# Patient Record
Sex: Female | Born: 1945 | Race: White | Hispanic: No | Marital: Married
Health system: Southern US, Community
[De-identification: ages and names within clinical notes are randomized; demographics above are authoritative.]

---

## 2013-06-29 ENCOUNTER — Inpatient Hospital Stay: Payer: Self-pay | Admitting: Family Medicine

## 2013-06-29 LAB — RETICULOCYTES
Absolute Retic Count: 0.0548 10*6/uL
Reticulocyte: 2.05 %

## 2013-06-29 LAB — APTT: Activated PTT: 56.1 secs — ABNORMAL HIGH (ref 23.6–35.9)

## 2013-06-29 LAB — CBC
MCHC: <26.5 g/dL (ref 32.0–36.0)
MCV: 52 fL — ABNORMAL LOW (ref 80–100)
Platelet: 222 10*3/uL (ref 150–440)
RBC: 2.51 10*6/uL — ABNORMAL LOW (ref 3.80–5.20)
WBC: 2.3 10*3/uL — ABNORMAL LOW (ref 3.6–11.0)

## 2013-06-29 LAB — COMPREHENSIVE METABOLIC PANEL
Albumin: 3.9 g/dL (ref 3.4–5.0)
Alkaline Phosphatase: 104 U/L (ref 50–136)
Anion Gap: 9 (ref 7–16)
Calcium, Total: 8.7 mg/dL (ref 8.5–10.1)
Co2: 24 mmol/L (ref 21–32)
Creatinine: 0.47 mg/dL — ABNORMAL LOW (ref 0.60–1.30)
Glucose: 99 mg/dL (ref 65–99)
Potassium: 3.5 mmol/L (ref 3.5–5.1)
Sodium: 141 mmol/L (ref 136–145)

## 2013-06-29 LAB — FERRITIN: Ferritin (ARMC): 4 ng/mL — ABNORMAL LOW (ref 8–388)

## 2013-06-29 LAB — PROTIME-INR: Prothrombin Time: 60.1 secs — ABNORMAL HIGH (ref 11.5–14.7)

## 2013-06-29 LAB — IRON AND TIBC: Iron Saturation: 2 %

## 2013-06-29 LAB — FOLATE: Folic Acid: 17.9 ng/mL (ref 3.1–100.0)

## 2013-06-29 LAB — CK TOTAL AND CKMB (NOT AT ARMC): CK, Total: 22 U/L (ref 21–215)

## 2013-06-29 LAB — PRO B NATRIURETIC PEPTIDE: B-Type Natriuretic Peptide: 667 pg/mL — ABNORMAL HIGH (ref 0–125)

## 2013-06-30 LAB — CBC WITH DIFFERENTIAL/PLATELET
Basophil #: 0 10*3/uL (ref 0.0–0.1)
Basophil %: 0.5 %
Eosinophil #: 0 10*3/uL (ref 0.0–0.7)
Eosinophil %: 0.9 %
HCT: 19.9 % — ABNORMAL LOW (ref 35.0–47.0)
Lymphocyte #: 0.9 10*3/uL — ABNORMAL LOW (ref 1.0–3.6)
MCH: 18.9 pg — ABNORMAL LOW (ref 26.0–34.0)
MCHC: 30.2 g/dL — ABNORMAL LOW (ref 32.0–36.0)
MCV: 62 fL — ABNORMAL LOW (ref 80–100)
RDW: 32.6 % — ABNORMAL HIGH (ref 11.5–14.5)
WBC: 3.6 10*3/uL (ref 3.6–11.0)

## 2013-06-30 LAB — BASIC METABOLIC PANEL
Anion Gap: 8 (ref 7–16)
Co2: 25 mmol/L (ref 21–32)
EGFR (African American): >60
Glucose: 82 mg/dL (ref 65–99)
Osmolality: 278 (ref 275–301)
Potassium: 3.5 mmol/L (ref 3.5–5.1)

## 2013-06-30 LAB — PROTIME-INR
INR: 2.3
Prothrombin Time: 24.6 secs — ABNORMAL HIGH (ref 11.5–14.7)

## 2013-07-01 LAB — CBC WITH DIFFERENTIAL/PLATELET
Basophil #: 0 10*3/uL (ref 0.0–0.1)
Basophil #: 0.1 10*3/uL (ref 0.0–0.1)
Basophil %: 1 %
Eosinophil #: 0.1 10*3/uL (ref 0.0–0.7)
Eosinophil #: 0.2 10*3/uL (ref 0.0–0.7)
Eosinophil %: 3.6 %
HCT: 28.6 % — ABNORMAL LOW (ref 35.0–47.0)
HCT: 38.1 % (ref 35.0–47.0)
HGB: 12 g/dL (ref 12.0–16.0)
Lymphocyte #: 1.9 10*3/uL (ref 1.0–3.6)
MCH: 22.2 pg — ABNORMAL LOW (ref 26.0–34.0)
MCHC: 32.1 g/dL (ref 32.0–36.0)
MCHC: 31.6 g/dL — ABNORMAL LOW (ref 32.0–36.0)
MCV: 69 fL — ABNORMAL LOW (ref 80–100)
MCV: 71 fL — ABNORMAL LOW (ref 80–100)
Monocyte #: 0.3 x10 3/mm (ref 0.2–0.9)
Neutrophil #: 1.9 10*3/uL (ref 1.4–6.5)
Neutrophil %: 57.4 %
Neutrophil %: 48.2 %
Platelet: 177 10*3/uL (ref 150–440)
Platelet: 228 10*3/uL (ref 150–440)
RBC: 4.13 10*6/uL (ref 3.80–5.20)
RBC: 5.4 10*6/uL — ABNORMAL HIGH (ref 3.80–5.20)
RDW: 35.1 % — ABNORMAL HIGH (ref 11.5–14.5)
WBC: 3.3 10*3/uL — ABNORMAL LOW (ref 3.6–11.0)
WBC: 5 10*3/uL (ref 3.6–11.0)

## 2013-07-01 LAB — BASIC METABOLIC PANEL
Anion Gap: 5 — ABNORMAL LOW (ref 7–16)
Calcium, Total: 8.3 mg/dL — ABNORMAL LOW (ref 8.5–10.1)
Co2: 29 mmol/L (ref 21–32)
Creatinine: 0.53 mg/dL — ABNORMAL LOW (ref 0.60–1.30)
EGFR (African American): >60
EGFR (Non-African Amer.): >60
Glucose: 96 mg/dL (ref 65–99)
Potassium: 3.2 mmol/L — ABNORMAL LOW (ref 3.5–5.1)

## 2013-07-01 LAB — PROTIME-INR
INR: 1.5
INR: 1.2
Prothrombin Time: 17.8 secs — ABNORMAL HIGH (ref 11.5–14.7)

## 2013-07-02 LAB — CBC WITH DIFFERENTIAL/PLATELET
Basophil #: 0.1 10*3/uL (ref 0.0–0.1)
Basophil %: 1.9 %
Eosinophil #: 0.2 10*3/uL (ref 0.0–0.7)
Eosinophil %: 6.2 %
HCT: 36.1 % (ref 35.0–47.0)
HGB: 11.4 g/dL — ABNORMAL LOW (ref 12.0–16.0)
Lymphocyte #: 0.9 10*3/uL — ABNORMAL LOW (ref 1.0–3.6)
Lymphocyte %: 29 %
MCH: 22.4 pg — ABNORMAL LOW (ref 26.0–34.0)
MCHC: 31.7 g/dL — ABNORMAL LOW (ref 32.0–36.0)
MCV: 71 fL — ABNORMAL LOW (ref 80–100)
Monocyte #: 0.2 x10 3/mm (ref 0.2–0.9)
Neutrophil %: 56.5 %
Platelet: 215 10*3/uL (ref 150–440)
RBC: 5.1 10*6/uL (ref 3.80–5.20)
RDW: 35.8 % — ABNORMAL HIGH (ref 11.5–14.5)
WBC: 3 10*3/uL — ABNORMAL LOW (ref 3.6–11.0)

## 2013-07-02 LAB — APTT
Activated PTT: >160 secs (ref 23.6–35.9)
Activated PTT: 28.1 secs (ref 23.6–35.9)

## 2013-07-02 LAB — BASIC METABOLIC PANEL
Anion Gap: 8 (ref 7–16)
BUN: 7 mg/dL (ref 7–18)
Calcium, Total: 9.1 mg/dL (ref 8.5–10.1)
Chloride: 105 mmol/L (ref 98–107)
Co2: 26 mmol/L (ref 21–32)
Creatinine: 0.59 mg/dL — ABNORMAL LOW (ref 0.60–1.30)
EGFR (African American): >60
Glucose: 82 mg/dL (ref 65–99)
Osmolality: 275 (ref 275–301)
Potassium: 3.7 mmol/L (ref 3.5–5.1)
Sodium: 139 mmol/L (ref 136–145)

## 2013-07-03 LAB — CBC WITH DIFFERENTIAL/PLATELET
Basophil %: 1.2 %
Eosinophil #: 0.1 10*3/uL (ref 0.0–0.7)
Eosinophil %: 2.3 %
HGB: 8.6 g/dL — ABNORMAL LOW (ref 12.0–16.0)
Lymphocyte #: 0.8 10*3/uL — ABNORMAL LOW (ref 1.0–3.6)
MCH: 22.5 pg — ABNORMAL LOW (ref 26.0–34.0)
MCHC: 32.1 g/dL (ref 32.0–36.0)
MCV: 70 fL — ABNORMAL LOW (ref 80–100)
Monocyte #: 0.2 x10 3/mm (ref 0.2–0.9)
Neutrophil %: 66.5 %

## 2013-07-03 LAB — BASIC METABOLIC PANEL
Chloride: 106 mmol/L (ref 98–107)
Co2: 27 mmol/L (ref 21–32)
Creatinine: 0.5 mg/dL — ABNORMAL LOW (ref 0.60–1.30)
EGFR (African American): >60
EGFR (Non-African Amer.): >60
Osmolality: 276 (ref 275–301)
Potassium: 3.4 mmol/L — ABNORMAL LOW (ref 3.5–5.1)
Sodium: 140 mmol/L (ref 136–145)

## 2013-07-04 LAB — HEMOGLOBIN: HGB: 8.2 g/dL — ABNORMAL LOW (ref 12.0–16.0)

## 2013-07-04 LAB — BASIC METABOLIC PANEL
Anion Gap: 3 — ABNORMAL LOW (ref 7–16)
BUN: 6 mg/dL — ABNORMAL LOW (ref 7–18)
Calcium, Total: 8.2 mg/dL — ABNORMAL LOW (ref 8.5–10.1)
Chloride: 110 mmol/L — ABNORMAL HIGH (ref 98–107)
Co2: 31 mmol/L (ref 21–32)
EGFR (African American): >60
EGFR (Non-African Amer.): >60
Glucose: 86 mg/dL (ref 65–99)
Sodium: 144 mmol/L (ref 136–145)

## 2013-07-04 LAB — MAGNESIUM: Magnesium: 1.6 mg/dL — ABNORMAL LOW

## 2013-07-05 LAB — CBC WITH DIFFERENTIAL/PLATELET
Basophil #: 0 10*3/uL (ref 0.0–0.1)
HCT: 27.3 % — ABNORMAL LOW (ref 35.0–47.0)
HGB: 8.6 g/dL — ABNORMAL LOW (ref 12.0–16.0)
MCH: 22.7 pg — ABNORMAL LOW (ref 26.0–34.0)
Monocyte %: 8.4 %
Neutrophil #: 1.5 10*3/uL (ref 1.4–6.5)
Neutrophil %: 51.9 %
RBC: 3.81 10*6/uL (ref 3.80–5.20)
RDW: 35.3 % — ABNORMAL HIGH (ref 11.5–14.5)
WBC: 3 10*3/uL — ABNORMAL LOW (ref 3.6–11.0)

## 2013-07-05 LAB — APTT
Activated PTT: 28.1 secs (ref 23.6–35.9)
Activated PTT: 73.3 secs — ABNORMAL HIGH (ref 23.6–35.9)

## 2013-07-05 LAB — PROTIME-INR: Prothrombin Time: 14 secs (ref 11.5–14.7)

## 2013-07-06 LAB — CBC WITH DIFFERENTIAL/PLATELET
Eosinophil: 5 %
HCT: 27.6 % — ABNORMAL LOW (ref 35.0–47.0)
HGB: 8.8 g/dL — ABNORMAL LOW (ref 12.0–16.0)
Lymphocytes: 33 %
MCHC: 31.8 g/dL — ABNORMAL LOW (ref 32.0–36.0)
Platelet: 123 10*3/uL — ABNORMAL LOW (ref 150–440)
RBC: 3.87 10*6/uL (ref 3.80–5.20)
RDW: 35.2 % — ABNORMAL HIGH (ref 11.5–14.5)
Variant Lymphocyte - H1-Rlymph: 1 %

## 2013-07-06 LAB — POTASSIUM: Potassium: 3.3 mmol/L — ABNORMAL LOW (ref 3.5–5.1)

## 2013-07-06 LAB — PROTIME-INR
INR: 1
Prothrombin Time: 13.8 secs (ref 11.5–14.7)

## 2013-07-06 LAB — PATHOLOGY REPORT

## 2013-07-07 LAB — APTT
Activated PTT: 114.9 secs — ABNORMAL HIGH (ref 23.6–35.9)
Activated PTT: 76.8 secs — ABNORMAL HIGH (ref 23.6–35.9)

## 2013-07-07 LAB — PROTIME-INR: INR: 1.1

## 2013-07-07 LAB — CBC WITH DIFFERENTIAL/PLATELET
Basophil %: 1 %
Eosinophil #: 0.1 10*3/uL (ref 0.0–0.7)
Eosinophil %: 3.8 %
HCT: 26.7 % — ABNORMAL LOW (ref 35.0–47.0)
HGB: 8.7 g/dL — ABNORMAL LOW (ref 12.0–16.0)
Lymphocyte %: 33.5 %
MCHC: 32.6 g/dL (ref 32.0–36.0)
MCV: 71 fL — ABNORMAL LOW (ref 80–100)
Monocyte #: 0.2 x10 3/mm (ref 0.2–0.9)
Monocyte %: 7.8 %
Neutrophil %: 53.9 %
Platelet: 121 10*3/uL — ABNORMAL LOW (ref 150–440)
RBC: 3.77 10*6/uL — ABNORMAL LOW (ref 3.80–5.20)
RDW: 35.1 % — ABNORMAL HIGH (ref 11.5–14.5)
WBC: 2.6 10*3/uL — ABNORMAL LOW (ref 3.6–11.0)

## 2013-07-08 LAB — PROTIME-INR
INR: 1.2
Prothrombin Time: 15.4 secs — ABNORMAL HIGH (ref 11.5–14.7)

## 2013-07-08 LAB — HEMOGLOBIN: HGB: 8.5 g/dL — ABNORMAL LOW (ref 12.0–16.0)

## 2013-07-08 LAB — PLATELET COUNT: Platelet: 131 10*3/uL — ABNORMAL LOW (ref 150–440)

## 2013-07-08 LAB — POTASSIUM: Potassium: 3.9 mmol/L (ref 3.5–5.1)

## 2013-07-09 LAB — PROTIME-INR
INR: 1.4
Prothrombin Time: 17.1 secs — ABNORMAL HIGH (ref 11.5–14.7)

## 2013-07-09 LAB — APTT: Activated PTT: 90.7 secs — ABNORMAL HIGH (ref 23.6–35.9)

## 2013-07-10 LAB — CBC WITH DIFFERENTIAL/PLATELET
Basophil #: 0 10*3/uL (ref 0.0–0.1)
Basophil %: 1.3 %
HCT: 25.8 % — ABNORMAL LOW (ref 35.0–47.0)
HGB: 8.2 g/dL — ABNORMAL LOW (ref 12.0–16.0)
Lymphocyte #: 1 10*3/uL (ref 1.0–3.6)
MCH: 23 pg — ABNORMAL LOW (ref 26.0–34.0)
MCHC: 31.8 g/dL — ABNORMAL LOW (ref 32.0–36.0)
MCV: 72 fL — ABNORMAL LOW (ref 80–100)
Monocyte %: 11.7 %
RBC: 3.56 10*6/uL — ABNORMAL LOW (ref 3.80–5.20)
WBC: 2.9 10*3/uL — ABNORMAL LOW (ref 3.6–11.0)

## 2013-07-10 LAB — PROTIME-INR
INR: 1.6
Prothrombin Time: 19.3 secs — ABNORMAL HIGH (ref 11.5–14.7)

## 2013-07-10 LAB — APTT: Activated PTT: 91.3 secs — ABNORMAL HIGH (ref 23.6–35.9)

## 2013-07-11 LAB — CBC WITH DIFFERENTIAL/PLATELET
HCT: 25.5 % — ABNORMAL LOW (ref 35.0–47.0)
HGB: 8.1 g/dL — ABNORMAL LOW (ref 12.0–16.0)
Lymphocyte #: 1.2 10*3/uL (ref 1.0–3.6)
Lymphocyte %: 38.9 %
MCHC: 31.5 g/dL — ABNORMAL LOW (ref 32.0–36.0)
MCV: 72 fL — ABNORMAL LOW (ref 80–100)
Monocyte #: 0.3 x10 3/mm (ref 0.2–0.9)
Monocyte %: 10.3 %
Neutrophil %: 45.1 %
Platelet: 132 10*3/uL — ABNORMAL LOW (ref 150–440)
RBC: 3.54 10*6/uL — ABNORMAL LOW (ref 3.80–5.20)
RDW: 33.7 % — ABNORMAL HIGH (ref 11.5–14.5)
WBC: 3.1 10*3/uL — ABNORMAL LOW (ref 3.6–11.0)

## 2013-07-11 LAB — PROTIME-INR: Prothrombin Time: 21.6 secs — ABNORMAL HIGH (ref 11.5–14.7)

## 2013-07-12 LAB — CBC WITH DIFFERENTIAL/PLATELET
Basophil #: 0.2 10*3/uL — ABNORMAL HIGH (ref 0.0–0.1)
Basophil %: 5.4 %
Eosinophil #: 0.1 10*3/uL (ref 0.0–0.7)
Eosinophil %: 4.9 %
HCT: 26 % — ABNORMAL LOW (ref 35.0–47.0)
HGB: 8.1 g/dL — ABNORMAL LOW (ref 12.0–16.0)
Lymphocyte %: 40.9 %
MCHC: 31.1 g/dL — ABNORMAL LOW (ref 32.0–36.0)
Monocyte #: 0.4 x10 3/mm (ref 0.2–0.9)
RBC: 3.58 10*6/uL — ABNORMAL LOW (ref 3.80–5.20)
RDW: 33.4 % — ABNORMAL HIGH (ref 11.5–14.5)
WBC: 3 10*3/uL — ABNORMAL LOW (ref 3.6–11.0)

## 2013-07-12 LAB — PROTIME-INR: INR: 1.7

## 2013-07-13 LAB — CBC WITH DIFFERENTIAL/PLATELET
Basophil %: 1.1 %
Eosinophil #: 0.1 10*3/uL (ref 0.0–0.7)
Eosinophil %: 4.3 %
HCT: 25.8 % — ABNORMAL LOW (ref 35.0–47.0)
Lymphocyte #: 1.3 10*3/uL (ref 1.0–3.6)
MCHC: 31.8 g/dL — ABNORMAL LOW (ref 32.0–36.0)
Monocyte #: 0.3 x10 3/mm (ref 0.2–0.9)
Neutrophil #: 1.1 10*3/uL — ABNORMAL LOW (ref 1.4–6.5)
RBC: 3.47 10*6/uL — ABNORMAL LOW (ref 3.80–5.20)

## 2013-07-13 LAB — APTT: Activated PTT: 100.8 secs — ABNORMAL HIGH (ref 23.6–35.9)

## 2013-07-13 LAB — PROTIME-INR: Prothrombin Time: 21.8 secs — ABNORMAL HIGH (ref 11.5–14.7)

## 2013-07-14 LAB — PROTIME-INR
INR: 2
Prothrombin Time: 22.6 secs — ABNORMAL HIGH (ref 11.5–14.7)

## 2014-07-17 ENCOUNTER — Inpatient Hospital Stay: Payer: Self-pay | Admitting: Internal Medicine

## 2014-07-17 ENCOUNTER — Ambulatory Visit: Payer: Self-pay | Admitting: Orthopedic Surgery

## 2014-07-17 LAB — PROTIME-INR
INR: 2
Prothrombin Time: 22.6 secs — ABNORMAL HIGH (ref 11.5–14.7)

## 2014-07-17 LAB — BASIC METABOLIC PANEL
Anion Gap: 4 — ABNORMAL LOW (ref 7–16)
BUN: 19 mg/dL — ABNORMAL HIGH (ref 7–18)
CHLORIDE: 111 mmol/L — AB (ref 98–107)
CO2: 29 mmol/L (ref 21–32)
CREATININE: 0.69 mg/dL (ref 0.60–1.30)
Calcium, Total: 8.5 mg/dL (ref 8.5–10.1)
EGFR (African American): >60
EGFR (Non-African Amer.): >60
Glucose: 92 mg/dL (ref 65–99)
OSMOLALITY: 289 (ref 275–301)
Potassium: 3.6 mmol/L (ref 3.5–5.1)
SODIUM: 144 mmol/L (ref 136–145)

## 2014-07-17 LAB — CBC
HCT: 41 % (ref 35.0–47.0)
HGB: 13.4 g/dL (ref 12.0–16.0)
MCH: 28.7 pg (ref 26.0–34.0)
MCHC: 32.8 g/dL (ref 32.0–36.0)
MCV: 88 fL (ref 80–100)
Platelet: 123 10*3/uL — ABNORMAL LOW (ref 150–440)
RBC: 4.68 10*6/uL (ref 3.80–5.20)
RDW: 16.6 % — AB (ref 11.5–14.5)
WBC: 5.9 10*3/uL (ref 3.6–11.0)

## 2014-07-18 LAB — CBC WITH DIFFERENTIAL/PLATELET
Basophil #: 0 10*3/uL (ref 0.0–0.1)
Basophil %: 0.4 %
Eosinophil #: 0 10*3/uL (ref 0.0–0.7)
Eosinophil %: 0.7 %
HCT: 37.9 % (ref 35.0–47.0)
HGB: 12 g/dL (ref 12.0–16.0)
Lymphocyte #: 1.1 10*3/uL (ref 1.0–3.6)
Lymphocyte %: 17.5 %
MCH: 28.3 pg (ref 26.0–34.0)
MCHC: 31.6 g/dL — ABNORMAL LOW (ref 32.0–36.0)
MCV: 89 fL (ref 80–100)
MONO ABS: 0.6 x10 3/mm (ref 0.2–0.9)
MONOS PCT: 10.6 %
NEUTROS PCT: 70.8 %
Neutrophil #: 4.3 10*3/uL (ref 1.4–6.5)
Platelet: 122 10*3/uL — ABNORMAL LOW (ref 150–440)
RBC: 4.24 10*6/uL (ref 3.80–5.20)
RDW: 16.8 % — ABNORMAL HIGH (ref 11.5–14.5)
WBC: 6.1 10*3/uL (ref 3.6–11.0)

## 2014-07-18 LAB — BASIC METABOLIC PANEL
ANION GAP: 4 — AB (ref 7–16)
BUN: 23 mg/dL — ABNORMAL HIGH (ref 7–18)
CREATININE: 0.66 mg/dL (ref 0.60–1.30)
Calcium, Total: 8.2 mg/dL — ABNORMAL LOW (ref 8.5–10.1)
Chloride: 110 mmol/L — ABNORMAL HIGH (ref 98–107)
Co2: 31 mmol/L (ref 21–32)
Glucose: 95 mg/dL (ref 65–99)
OSMOLALITY: 292 (ref 275–301)
Potassium: 4 mmol/L (ref 3.5–5.1)
Sodium: 145 mmol/L (ref 136–145)

## 2014-07-18 LAB — MAGNESIUM: MAGNESIUM: 2 mg/dL

## 2014-07-19 LAB — PROTIME-INR
INR: 1.8
Prothrombin Time: 20.4 secs — ABNORMAL HIGH (ref 11.5–14.7)

## 2014-07-20 LAB — PROTIME-INR
INR: 1.7
PROTHROMBIN TIME: 19.9 s — AB (ref 11.5–14.7)

## 2014-07-21 LAB — PROTIME-INR
INR: 2.8
Prothrombin Time: 28.4 secs — ABNORMAL HIGH (ref 11.5–14.7)

## 2014-08-15 LAB — CBC
HCT: 36.2 % (ref 35.0–47.0)
HGB: 12 g/dL (ref 12.0–16.0)
MCH: 28.5 pg (ref 26.0–34.0)
MCHC: 33.1 g/dL (ref 32.0–36.0)
MCV: 86 fL (ref 80–100)
PLATELETS: 115 10*3/uL — AB (ref 150–440)
RBC: 4.2 10*6/uL (ref 3.80–5.20)
RDW: 17 % — AB (ref 11.5–14.5)
WBC: 6 10*3/uL (ref 3.6–11.0)

## 2014-08-15 LAB — APTT: Activated PTT: 59.9 secs — ABNORMAL HIGH (ref 23.6–35.9)

## 2014-08-15 LAB — BASIC METABOLIC PANEL
Anion Gap: 13 (ref 7–16)
BUN: 13 mg/dL (ref 7–18)
CALCIUM: 8.4 mg/dL — AB (ref 8.5–10.1)
CO2: 21 mmol/L (ref 21–32)
Chloride: 106 mmol/L (ref 98–107)
Creatinine: 0.43 mg/dL — ABNORMAL LOW (ref 0.60–1.30)
EGFR (African American): >60
Glucose: 111 mg/dL — ABNORMAL HIGH (ref 65–99)
Osmolality: 280 (ref 275–301)
POTASSIUM: 3.5 mmol/L (ref 3.5–5.1)
Sodium: 140 mmol/L (ref 136–145)

## 2014-08-15 LAB — PROTIME-INR
INR: 2.4
Prothrombin Time: 25.6 secs — ABNORMAL HIGH (ref 11.5–14.7)

## 2014-08-15 LAB — TROPONIN I: Troponin-I: <0.02 ng/mL

## 2014-08-16 ENCOUNTER — Inpatient Hospital Stay: Payer: Self-pay | Admitting: Internal Medicine

## 2014-08-16 LAB — URINALYSIS, COMPLETE
Bilirubin,UR: NEGATIVE
Glucose,UR: NEGATIVE mg/dL (ref 0–75)
Leukocyte Esterase: NEGATIVE
Nitrite: NEGATIVE
Ph: 5 (ref 4.5–8.0)
Protein: 30
SPECIFIC GRAVITY: 1.028 (ref 1.003–1.030)
Squamous Epithelial: NONE SEEN
WBC UR: 10 /HPF (ref 0–5)

## 2014-08-17 LAB — CBC WITH DIFFERENTIAL/PLATELET
BASOS ABS: 0 10*3/uL (ref 0.0–0.1)
BASOS PCT: 0.1 %
Eosinophil #: 0 10*3/uL (ref 0.0–0.7)
Eosinophil %: 0.1 %
HCT: 32.5 % — AB (ref 35.0–47.0)
HGB: 10.3 g/dL — ABNORMAL LOW (ref 12.0–16.0)
LYMPHS ABS: 0.2 10*3/uL — AB (ref 1.0–3.6)
Lymphocyte %: 3.7 %
MCH: 27.6 pg (ref 26.0–34.0)
MCHC: 31.6 g/dL — ABNORMAL LOW (ref 32.0–36.0)
MCV: 87 fL (ref 80–100)
MONO ABS: 0.3 x10 3/mm (ref 0.2–0.9)
MONOS PCT: 6.1 %
Neutrophil #: 4.1 10*3/uL (ref 1.4–6.5)
Neutrophil %: 90 %
Platelet: 83 10*3/uL — ABNORMAL LOW (ref 150–440)
RBC: 3.72 10*6/uL — AB (ref 3.80–5.20)
RDW: 17.2 % — ABNORMAL HIGH (ref 11.5–14.5)
WBC: 4.5 10*3/uL (ref 3.6–11.0)

## 2014-08-17 LAB — BASIC METABOLIC PANEL
Anion Gap: 10 (ref 7–16)
BUN: 16 mg/dL (ref 7–18)
CHLORIDE: 100 mmol/L (ref 98–107)
Calcium, Total: 8.8 mg/dL (ref 8.5–10.1)
Co2: 26 mmol/L (ref 21–32)
Creatinine: 0.7 mg/dL (ref 0.60–1.30)
EGFR (Non-African Amer.): >60
Glucose: 150 mg/dL — ABNORMAL HIGH (ref 65–99)
OSMOLALITY: 276 (ref 275–301)
Potassium: 3.3 mmol/L — ABNORMAL LOW (ref 3.5–5.1)
Sodium: 136 mmol/L (ref 136–145)

## 2014-08-17 LAB — PROTIME-INR
INR: 2.8
PROTHROMBIN TIME: 28.5 s — AB (ref 11.5–14.7)

## 2014-08-17 LAB — MAGNESIUM: MAGNESIUM: 1.2 mg/dL — AB

## 2014-08-17 LAB — SEDIMENTATION RATE: ERYTHROCYTE SED RATE: 45 mm/h — AB (ref 0–30)

## 2014-08-18 LAB — BASIC METABOLIC PANEL
Anion Gap: 9 (ref 7–16)
BUN: 15 mg/dL (ref 7–18)
CHLORIDE: 96 mmol/L — AB (ref 98–107)
Calcium, Total: 8.1 mg/dL — ABNORMAL LOW (ref 8.5–10.1)
Co2: 28 mmol/L (ref 21–32)
Creatinine: 0.67 mg/dL (ref 0.60–1.30)
GLUCOSE: 109 mg/dL — AB (ref 65–99)
OSMOLALITY: 268 (ref 275–301)
Potassium: 3.4 mmol/L — ABNORMAL LOW (ref 3.5–5.1)
Sodium: 133 mmol/L — ABNORMAL LOW (ref 136–145)

## 2014-08-18 LAB — PROTIME-INR
INR: 3.4
PROTHROMBIN TIME: 33 s — AB (ref 11.5–14.7)

## 2014-08-18 LAB — VANCOMYCIN, TROUGH: Vancomycin, Trough: 5 ug/mL — ABNORMAL LOW (ref 10–20)

## 2014-08-18 LAB — URINE CULTURE

## 2014-08-19 LAB — GENTAMICIN LEVEL, TROUGH: Gentamicin, Trough: 0.4 ug/mL (ref 0.0–2.0)

## 2014-08-19 LAB — PROTIME-INR
INR: 5 — AB
PROTHROMBIN TIME: 44.8 s — AB (ref 11.5–14.7)

## 2014-08-19 LAB — POTASSIUM: POTASSIUM: 3.8 mmol/L (ref 3.5–5.1)

## 2014-08-19 LAB — MAGNESIUM: Magnesium: 1.9 mg/dL

## 2014-08-20 LAB — GENTAMICIN LEVEL, TROUGH: Gentamicin, Trough: 0.5 ug/mL (ref 0.0–2.0)

## 2014-08-20 LAB — PROTIME-INR
INR: 7.9
Prothrombin Time: 63.2 secs — ABNORMAL HIGH (ref 11.5–14.7)

## 2014-08-21 LAB — VANCOMYCIN, TROUGH
VANCOMYCIN, TROUGH: 23 ug/mL — AB (ref 10–20)
Vancomycin, Trough: 20 ug/mL (ref 10–20)

## 2014-08-21 LAB — PROTIME-INR
INR: 8
Prothrombin Time: 63.9 secs — ABNORMAL HIGH (ref 11.5–14.7)

## 2014-08-21 LAB — CULTURE, BLOOD (SINGLE)

## 2014-08-22 LAB — COMPREHENSIVE METABOLIC PANEL
AST: 24 U/L (ref 15–37)
Albumin: 2 g/dL — ABNORMAL LOW (ref 3.4–5.0)
Alkaline Phosphatase: 71 U/L
Anion Gap: 8 (ref 7–16)
BUN: 7 mg/dL (ref 7–18)
Bilirubin,Total: 1.4 mg/dL — ABNORMAL HIGH (ref 0.2–1.0)
CALCIUM: 8.1 mg/dL — AB (ref 8.5–10.1)
Chloride: 93 mmol/L — ABNORMAL LOW (ref 98–107)
Co2: 36 mmol/L — ABNORMAL HIGH (ref 21–32)
Creatinine: 0.6 mg/dL (ref 0.60–1.30)
EGFR (Non-African Amer.): >60
Glucose: 120 mg/dL — ABNORMAL HIGH (ref 65–99)
Osmolality: 273 (ref 275–301)
Potassium: 3 mmol/L — ABNORMAL LOW (ref 3.5–5.1)
SGPT (ALT): 15 U/L
Sodium: 137 mmol/L (ref 136–145)
Total Protein: 5.5 g/dL — ABNORMAL LOW (ref 6.4–8.2)

## 2014-08-22 LAB — MAGNESIUM: Magnesium: 1.9 mg/dL

## 2014-08-22 LAB — GENTAMICIN LEVEL, TROUGH: GENTAMICIN, TROUGH: 0.4 ug/mL (ref 0.0–2.0)

## 2014-08-22 LAB — CULTURE, BLOOD (SINGLE)

## 2014-08-22 LAB — PROTIME-INR
INR: 11.6
Prothrombin Time: 85.2 secs — ABNORMAL HIGH (ref 11.5–14.7)

## 2014-08-22 LAB — HEMOGLOBIN: HGB: 8.9 g/dL — AB (ref 12.0–16.0)

## 2014-08-22 LAB — GENTAMICIN LEVEL, PEAK: Gentamicin, Peak: 4 ug/mL (ref 4.0–8.0)

## 2014-08-23 LAB — CBC WITH DIFFERENTIAL/PLATELET
Basophil #: 0.1 10*3/uL (ref 0.0–0.1)
Basophil %: 1.4 %
EOS ABS: 0.2 10*3/uL (ref 0.0–0.7)
EOS PCT: 2.9 %
HCT: 30.3 % — ABNORMAL LOW (ref 35.0–47.0)
HGB: 9.7 g/dL — ABNORMAL LOW (ref 12.0–16.0)
LYMPHS PCT: 14.4 %
Lymphocyte #: 0.8 10*3/uL — ABNORMAL LOW (ref 1.0–3.6)
MCH: 27.3 pg (ref 26.0–34.0)
MCHC: 32.1 g/dL (ref 32.0–36.0)
MCV: 85 fL (ref 80–100)
MONOS PCT: 12.6 %
Monocyte #: 0.7 x10 3/mm (ref 0.2–0.9)
NEUTROS ABS: 3.6 10*3/uL (ref 1.4–6.5)
Neutrophil %: 68.7 %
PLATELETS: 239 10*3/uL (ref 150–440)
RBC: 3.56 10*6/uL — AB (ref 3.80–5.20)
RDW: 17.4 % — ABNORMAL HIGH (ref 11.5–14.5)
WBC: 5.3 10*3/uL (ref 3.6–11.0)

## 2014-08-23 LAB — CULTURE, BLOOD (SINGLE)

## 2014-08-23 LAB — APTT: ACTIVATED PTT: 51.9 s — AB (ref 23.6–35.9)

## 2014-08-23 LAB — POTASSIUM: Potassium: 3.7 mmol/L (ref 3.5–5.1)

## 2014-08-23 LAB — HEMOGLOBIN: HGB: 9.1 g/dL — AB (ref 12.0–16.0)

## 2014-08-23 LAB — PROTIME-INR
INR: 1.2
INR: 1.1
PROTHROMBIN TIME: 14.8 s — AB (ref 11.5–14.7)
Prothrombin Time: 14.2 secs (ref 11.5–14.7)

## 2014-08-23 LAB — HEPARIN LEVEL (UNFRACTIONATED): ANTI-XA(UNFRACTIONATED): 0.16 [IU]/mL — AB (ref 0.30–0.70)

## 2014-08-24 LAB — CBC WITH DIFFERENTIAL/PLATELET
Basophil #: 0 10*3/uL (ref 0.0–0.1)
Basophil %: 0.9 %
Eosinophil #: 0.2 10*3/uL (ref 0.0–0.7)
Eosinophil %: 3.3 %
HCT: 31.8 % — ABNORMAL LOW (ref 35.0–47.0)
HGB: 10.2 g/dL — ABNORMAL LOW (ref 12.0–16.0)
Lymphocyte #: 0.9 10*3/uL — ABNORMAL LOW (ref 1.0–3.6)
Lymphocyte %: 17.7 %
MCH: 27.6 pg (ref 26.0–34.0)
MCHC: 32.1 g/dL (ref 32.0–36.0)
MCV: 86 fL (ref 80–100)
MONOS PCT: 11.3 %
Monocyte #: 0.6 x10 3/mm (ref 0.2–0.9)
NEUTROS ABS: 3.4 10*3/uL (ref 1.4–6.5)
Neutrophil %: 66.8 %
Platelet: 271 10*3/uL (ref 150–440)
RBC: 3.71 10*6/uL — AB (ref 3.80–5.20)
RDW: 17.8 % — AB (ref 11.5–14.5)
WBC: 5.2 10*3/uL (ref 3.6–11.0)

## 2014-08-24 LAB — PROTIME-INR
INR: 1
Prothrombin Time: 13.5 secs (ref 11.5–14.7)

## 2014-08-24 LAB — VANCOMYCIN, TROUGH: Vancomycin, Trough: 18 ug/mL (ref 10–20)

## 2014-08-24 LAB — HEPARIN LEVEL (UNFRACTIONATED)
ANTI-XA(UNFRACTIONATED): 0.31 [IU]/mL (ref 0.30–0.70)
Anti-Xa(Unfractionated): 0.31 IU/mL (ref 0.30–0.70)

## 2014-08-25 LAB — CBC WITH DIFFERENTIAL/PLATELET
BASOS ABS: 0 10*3/uL (ref 0.0–0.1)
Basophil %: 0.6 %
Eosinophil #: 0.2 10*3/uL (ref 0.0–0.7)
Eosinophil %: 3 %
HCT: 30.6 % — ABNORMAL LOW (ref 35.0–47.0)
HGB: 10.1 g/dL — AB (ref 12.0–16.0)
LYMPHS PCT: 15.3 %
Lymphocyte #: 0.9 10*3/uL — ABNORMAL LOW (ref 1.0–3.6)
MCH: 28.2 pg (ref 26.0–34.0)
MCHC: 33 g/dL (ref 32.0–36.0)
MCV: 85 fL (ref 80–100)
Monocyte #: 0.7 x10 3/mm (ref 0.2–0.9)
Monocyte %: 12.9 %
NEUTROS ABS: 3.8 10*3/uL (ref 1.4–6.5)
Neutrophil %: 68.2 %
PLATELETS: 310 10*3/uL (ref 150–440)
RBC: 3.59 10*6/uL — ABNORMAL LOW (ref 3.80–5.20)
RDW: 17.9 % — ABNORMAL HIGH (ref 11.5–14.5)
WBC: 5.6 10*3/uL (ref 3.6–11.0)

## 2014-08-25 LAB — PROTIME-INR
INR: 1.2
PROTHROMBIN TIME: 14.6 s (ref 11.5–14.7)

## 2014-08-25 LAB — CREATININE, SERUM
Creatinine: 0.67 mg/dL (ref 0.60–1.30)
EGFR (Non-African Amer.): >60

## 2014-08-25 LAB — HEPARIN LEVEL (UNFRACTIONATED)
Anti-Xa(Unfractionated): 0.29 IU/mL — ABNORMAL LOW (ref 0.30–0.70)
Anti-Xa(Unfractionated): 0.41 IU/mL (ref 0.30–0.70)

## 2014-08-26 LAB — CBC WITH DIFFERENTIAL/PLATELET
BASOS ABS: 0.1 10*3/uL (ref 0.0–0.1)
BASOS PCT: 1 %
EOS ABS: 0.1 10*3/uL (ref 0.0–0.7)
Eosinophil %: 2.2 %
HCT: 30.9 % — ABNORMAL LOW (ref 35.0–47.0)
HGB: 10 g/dL — ABNORMAL LOW (ref 12.0–16.0)
Lymphocyte #: 1 10*3/uL (ref 1.0–3.6)
Lymphocyte %: 15.7 %
MCH: 27.7 pg (ref 26.0–34.0)
MCHC: 32.4 g/dL (ref 32.0–36.0)
MCV: 86 fL (ref 80–100)
Monocyte #: 0.8 x10 3/mm (ref 0.2–0.9)
Monocyte %: 11.8 %
NEUTROS ABS: 4.5 10*3/uL (ref 1.4–6.5)
Neutrophil %: 69.3 %
Platelet: 303 10*3/uL (ref 150–440)
RBC: 3.62 10*6/uL — ABNORMAL LOW (ref 3.80–5.20)
RDW: 17.5 % — ABNORMAL HIGH (ref 11.5–14.5)
WBC: 6.6 10*3/uL (ref 3.6–11.0)

## 2014-08-26 LAB — HEPARIN LEVEL (UNFRACTIONATED): Anti-Xa(Unfractionated): 0.45 IU/mL (ref 0.30–0.70)

## 2014-08-26 LAB — PROTIME-INR
INR: 1.6
PROTHROMBIN TIME: 18.8 s — AB (ref 11.5–14.7)

## 2014-08-26 LAB — GENTAMICIN LEVEL, TROUGH: Gentamicin, Trough: 1.5 ug/mL (ref 0.0–2.0)

## 2014-08-26 LAB — GENTAMICIN LEVEL, PEAK: Gentamicin, Peak: 3.4 ug/mL — ABNORMAL LOW (ref 4.0–8.0)

## 2014-08-27 LAB — CREATININE, SERUM
Creatinine: 0.98 mg/dL (ref 0.60–1.30)
EGFR (African American): >60
EGFR (Non-African Amer.): 59 — ABNORMAL LOW

## 2014-08-27 LAB — CBC WITH DIFFERENTIAL/PLATELET
BASOS PCT: 1 %
Basophil #: 0.1 10*3/uL (ref 0.0–0.1)
EOS PCT: 2.6 %
Eosinophil #: 0.2 10*3/uL (ref 0.0–0.7)
HCT: 32 % — AB (ref 35.0–47.0)
HGB: 10.2 g/dL — ABNORMAL LOW (ref 12.0–16.0)
LYMPHS ABS: 1.2 10*3/uL (ref 1.0–3.6)
Lymphocyte %: 15.1 %
MCH: 27.2 pg (ref 26.0–34.0)
MCHC: 31.9 g/dL — AB (ref 32.0–36.0)
MCV: 85 fL (ref 80–100)
Monocyte #: 0.9 x10 3/mm (ref 0.2–0.9)
Monocyte %: 11.1 %
Neutrophil #: 5.5 10*3/uL (ref 1.4–6.5)
Neutrophil %: 70.2 %
Platelet: 352 10*3/uL (ref 150–440)
RBC: 3.76 10*6/uL — ABNORMAL LOW (ref 3.80–5.20)
RDW: 17.8 % — AB (ref 11.5–14.5)
WBC: 7.9 10*3/uL (ref 3.6–11.0)

## 2014-08-27 LAB — PROTIME-INR
INR: 1.8
Prothrombin Time: 20.6 secs — ABNORMAL HIGH (ref 11.5–14.7)

## 2014-08-27 LAB — HEPARIN LEVEL (UNFRACTIONATED): Anti-Xa(Unfractionated): 0.63 IU/mL (ref 0.30–0.70)

## 2014-08-27 LAB — VANCOMYCIN, TROUGH: VANCOMYCIN, TROUGH: 23 ug/mL — AB (ref 10–20)

## 2014-08-28 LAB — CBC WITH DIFFERENTIAL/PLATELET
Basophil #: 0.1 10*3/uL (ref 0.0–0.1)
Basophil %: 0.9 %
Eosinophil #: 0.1 10*3/uL (ref 0.0–0.7)
Eosinophil %: 1.9 %
HCT: 32 % — ABNORMAL LOW (ref 35.0–47.0)
HGB: 10.5 g/dL — AB (ref 12.0–16.0)
LYMPHS ABS: 1 10*3/uL (ref 1.0–3.6)
LYMPHS PCT: 13 %
MCH: 27.8 pg (ref 26.0–34.0)
MCHC: 32.8 g/dL (ref 32.0–36.0)
MCV: 85 fL (ref 80–100)
MONO ABS: 0.8 x10 3/mm (ref 0.2–0.9)
Monocyte %: 10.1 %
NEUTROS PCT: 74.1 %
Neutrophil #: 5.8 10*3/uL (ref 1.4–6.5)
Platelet: 286 10*3/uL (ref 150–440)
RBC: 3.78 10*6/uL — ABNORMAL LOW (ref 3.80–5.20)
RDW: 17.7 % — ABNORMAL HIGH (ref 11.5–14.5)
WBC: 7.8 10*3/uL (ref 3.6–11.0)

## 2014-08-28 LAB — GENTAMICIN LEVEL, TROUGH: Gentamicin, Trough: 1.5 ug/mL (ref 0.0–2.0)

## 2014-08-28 LAB — HEPARIN LEVEL (UNFRACTIONATED): Anti-Xa(Unfractionated): 0.61 IU/mL (ref 0.30–0.70)

## 2014-08-28 LAB — PROTIME-INR
INR: 2.4
Prothrombin Time: 25.9 secs — ABNORMAL HIGH (ref 11.5–14.7)

## 2014-08-28 LAB — CREATININE, SERUM
Creatinine: 0.99 mg/dL (ref 0.60–1.30)
EGFR (African American): >60
GFR CALC NON AF AMER: 59 — AB

## 2014-08-28 LAB — GENTAMICIN LEVEL, PEAK: Gentamicin, Peak: 6.9 ug/mL (ref 4.0–8.0)

## 2014-08-29 LAB — CREATININE, SERUM
Creatinine: 1.35 mg/dL — ABNORMAL HIGH (ref 0.60–1.30)
EGFR (Non-African Amer.): 40 — ABNORMAL LOW
GFR CALC AF AMER: 47 — AB

## 2014-08-29 LAB — PROTIME-INR
INR: 4
Prothrombin Time: 37.4 secs — ABNORMAL HIGH (ref 11.5–14.7)

## 2014-08-30 ENCOUNTER — Other Ambulatory Visit: Payer: Self-pay | Admitting: Family Medicine

## 2014-08-30 LAB — CBC WITH DIFFERENTIAL/PLATELET
BASOS ABS: 0.1 10*3/uL (ref 0.0–0.1)
BASOS PCT: 0.7 %
EOS PCT: 1.2 %
Eosinophil #: 0.1 10*3/uL (ref 0.0–0.7)
HCT: 32.8 % — ABNORMAL LOW (ref 35.0–47.0)
HGB: 10.2 g/dL — AB (ref 12.0–16.0)
LYMPHS ABS: 1.2 10*3/uL (ref 1.0–3.6)
Lymphocyte %: 11.6 %
MCH: 26.9 pg (ref 26.0–34.0)
MCHC: 31 g/dL — ABNORMAL LOW (ref 32.0–36.0)
MCV: 87 fL (ref 80–100)
MONOS PCT: 8 %
Monocyte #: 0.8 x10 3/mm (ref 0.2–0.9)
Neutrophil #: 7.8 10*3/uL — ABNORMAL HIGH (ref 1.4–6.5)
Neutrophil %: 78.5 %
Platelet: 280 10*3/uL (ref 150–440)
RBC: 3.78 10*6/uL — ABNORMAL LOW (ref 3.80–5.20)
RDW: 17.9 % — ABNORMAL HIGH (ref 11.5–14.5)
WBC: 10 10*3/uL (ref 3.6–11.0)

## 2014-08-30 LAB — CREATININE, SERUM
CREATININE: 1.41 mg/dL — AB (ref 0.60–1.30)
EGFR (Non-African Amer.): 38 — ABNORMAL LOW
GFR CALC AF AMER: 44 — AB

## 2014-08-30 LAB — VANCOMYCIN, TROUGH: VANCOMYCIN, TROUGH: 16 ug/mL (ref 10–20)

## 2014-09-18 ENCOUNTER — Other Ambulatory Visit: Payer: Self-pay

## 2014-09-18 LAB — VANCOMYCIN, TROUGH: VANCOMYCIN, TROUGH: 35 ug/mL — AB (ref 10–20)

## 2014-09-19 ENCOUNTER — Other Ambulatory Visit: Payer: Self-pay

## 2014-09-19 LAB — VANCOMYCIN, TROUGH: Vancomycin, Trough: 32 ug/mL (ref 10–20)

## 2015-03-25 DEATH — deceased

## 2015-04-15 NOTE — Consult Note (Signed)
Brief Consult Note: Diagnosis: IDA.   Patient was seen by consultant.   Consult note dictated.   Discussed with Attending MD.   Comments: SEE DICTATED NOTE  DISCUSSED WITH DR PATEL TONIGHT, ALSO DR OH Colin BroachAND DR FATH. RE ANEMIA, NO SUSPICUION OF HEMATOLOGIC DISORDER. AFTER GI W/U, IF HGB RECONSTITUTES, WILL NEED F/U SERIAL CBC TO WATCH FOR RECURRENT DROPP.  IF HGB DOES NOT RECONSTITUTE ON PO IRON, SHOULD RECONSULT HEME FOR POSSIBLE IV IRON OR OTHER CAUSES OF ANEMIA. THERE WAS ALSO SOME SPURRIOUS OR TRANSIENT LEUKOPENIA, WOULD REPEAT CBC.  RE ANTICOAGULATION, ALTHOUGH AORTIC VALVE LOWER RISK THAN MITRAL, AS ANTICOAGULATION HAS BEEN COMPLETELY REVERSED, WOULD RESTART ANTICOAGULATION WITH IV HEPARIN...HOLD 6 HRS BEFORE PROCEEDURE OR AS PER GI, RESTART AFTER PROCEEDURE, WHEN OK WITH GI, THEN RESUME COUMADIN AT LOWER THAN PRIOR DOSE,  AND WATCH FOR COUMADIN RESISTANCE EARLY ON DUE TO LINGERING EFFECT OF VIT K. MAY NEED TO D/C LATER ON LOVENOX IF HAVE NOT ACHIEVED COUMADINIZATION.  Electronic Signatures: Marin RobertsGittin, Robert G (MD)  (Signed 09-Jul-14 20:58)  Authored: Brief Consult Note   Last Updated: 09-Jul-14 20:58 by Marin RobertsGittin, Robert G (MD)

## 2015-04-15 NOTE — Consult Note (Signed)
Chief Complaint:  Subjective/Chief Complaint Please see full colonoscopy and egd repoet.  EGD with minimal gastritis, biopsy taken.  Colonoscopy with 9 polyps the largest being over 2 cm in size.  Patient was given 2 gm ampicillin during the proceedure whit this finding and the need to resect myltiple lesions.  Case discussed with Dr Auburn BilberryShreyang Patel.  Recommend several days of iv abx, hold anticoagulation for several days due to high risk for bleeding.  Continue clear liquids today, may advance to full liquids late tomorrow.  Following.   Electronic Signatures: Barnetta ChapelSkulskie, Martin (MD)  (Signed 10-Jul-14 15:57)  Authored: Chief Complaint   Last Updated: 10-Jul-14 15:57 by Barnetta ChapelSkulskie, Martin (MD)

## 2015-04-15 NOTE — Consult Note (Signed)
Brief Consult Note: Diagnosis: iron deficient, microcytic anemia.   Patient was seen by consultant.   Consult note dictated.   Recommend to proceed with surgery or procedure.   Recommend further assessment or treatment.   Orders entered.   Comments: Please see full GI consult [id number].  patient admitted with profound IDA/marked microcytosis in the setting of h/o mechanical heart valve and problems of poor medical followup due to agoraphobia.  Noted to have fractures in the distal tibia and fibula from 4 months ago due to desire to avoid going out.  Recommend pathology review of blood smear from admission, luminal evaluation via egd and colonoscopy when clinically feasible, once anemia repleted.  Thandk you for this consult.  Following..  Electronic Signatures: Barnetta ChapelSkulskie, Martin (MD)  (Signed 07-Jul-14 19:59)  Authored: Brief Consult Note   Last Updated: 07-Jul-14 19:59 by Barnetta ChapelSkulskie, Martin (MD)

## 2015-04-15 NOTE — Consult Note (Signed)
Chief Complaint:  Subjective/Chief Complaint seen for profound anemia, microcytic anemia.  doing well, no n/v or abd pain.  no black or bloody stools.   VITAL SIGNS/ANCILLARY NOTES: **Vital Signs.:   14-Jul-14 13:33  Vital Signs Type Routine  Temperature Temperature (F) 97.4  Celsius 36.3  Temperature Source oral  Pulse Pulse 60  Respirations Respirations 20  Systolic BP Systolic BP 127  Diastolic BP (mmHg) Diastolic BP (mmHg) 56  Mean BP 79  Pulse Ox % Pulse Ox % 99  Pulse Ox Activity Level  At rest  Oxygen Delivery Room Air/ 21 %   Brief Assessment:  Cardiac Regular   Respiratory clear BS   Gastrointestinal details normal Soft  Nontender  Nondistended  No masses palpable  Bowel sounds normal   Lab Results: Routine Chem:  14-Jul-14 04:34   Potassium, Serum  3.3 (Result(s) reported on 06 Jul 2013 at 04:49AM.)  Magnesium, Serum  1.7 (1.8-2.4 THERAPEUTIC RANGE: 4-7 mg/dL TOXIC: > 10 mg/dL  -----------------------)  Routine Coag:  14-Jul-14 04:34   Activated PTT (APTT)  101.0 (A HCT value >55% may artifactually increase the APTT. In one study, the increase was an average of 19%. Reference: "Effect on Routine and Special Coagulation Testing Values of Citrate Anticoagulant Adjustment in Patients with High HCT Values." American Journal of Clinical Pathology 2006;126:400-405.)  Prothrombin 13.8  INR 1.0 (INR reference interval applies to patients on anticoagulant therapy. A single INR therapeutic range for coumarins is not optimal for all indications; however, the suggested range for most indications is 2.0 - 3.0. Exceptions to the INR Reference Range may include: Prosthetic heart valves, acute myocardial infarction, prevention of myocardial infarction, and combinations of aspirin and anticoagulant. The need for a higher or lower target INR must be assessed individually. Reference: The Pharmacology and Management of the Vitamin K  antagonists: the seventh ACCP Conference  on Antithrombotic and Thrombolytic Therapy. Chest.2004 Sept:126 (3suppl): [id number]. A HCT value >55% may artifactually increase the PT.  In one study,  the increase was an average of 25%. Reference:  "Effect on Routine and Special Coagulation Testing Values of Citrate Anticoagulant Adjustment in Patients with High HCT Values." American Journal of Clinical Pathology 2006;126:400-405.)  Routine Hem:  14-Jul-14 04:34   WBC (CBC)  3.1  RBC (CBC) 3.87  Hemoglobin (CBC)  8.8  Hematocrit (CBC)  27.6  Platelet Count (CBC)  123 (Result(s) reported on 06 Jul 2013 at 04:49AM.)  MCV  71  MCH  22.7  MCHC  31.8  RDW  35.2  Segmented Neutrophils 55  Lymphocytes 33  Variant Lymphocytes 1  Monocytes 6  Eosinophil 5  Diff Comment 1 ANISOCYTOSIS  Diff Comment 2 POIKILOCYTOSIS  Diff Comment 3 HYPOCHROMIA  Diff Comment 4 LARGE PLATELETS  Diff Comment 5 PLTS VARIED IN SIZE  Diff Comment 6 DIMORPHIC RBCS  Result(s) reported on 06 Jul 2013 at 04:49AM.   Assessment/Plan:  Assessment/Plan:  Assessment 1) anemia, microcytic/IDA-stable post tfx.  no evidence of  GI bleeding.  multiple polyps removed (9) on colonoscopy, normal egd.  All polyps were consistant with tubular adenoma without high grade dysplasia.   2) artificial cardiac valve, now back on coumadin, awaiting theraputic coumadin level.   Plan 1) Patient will need to have repeat colonoscopy in one year.  She wants to establish with a PMD at [facility] or in [place].  Perhaps referral can be made on D/C.  Will need GI fu in 2-3 weeks for recheck hgb.   Will follow at a distance.  Electronic Signatures: Barnetta ChapelSkulskie, Adriane Gabbert (MD)  (Signed 14-Jul-14 17:12)  Authored: Chief Complaint, VITAL SIGNS/ANCILLARY NOTES, Brief Assessment, Lab Results, Assessment/Plan   Last Updated: 14-Jul-14 17:12 by Barnetta ChapelSkulskie, Richey Doolittle (MD)

## 2015-04-15 NOTE — Consult Note (Signed)
Chief Complaint:  Subjective/Chief Complaint seen for profound microcytic anemia.  denies n/v or abdominal pain.  denies black or bloody stools.   VITAL SIGNS/ANCILLARY NOTES: **Vital Signs.:   08-Jul-14 18:00  Vital Signs Type Blood Transfusion Complete  Temperature Temperature (F) 98.2  Celsius 36.7  Temperature Source oral  Pulse Pulse 60  Respirations Respirations 18  Systolic BP Systolic BP 272  Diastolic BP (mmHg) Diastolic BP (mmHg) 63  Mean BP 83  Pulse Ox % Pulse Ox % 100  Oxygen Delivery 2L  *Intake and Output.:   08-Jul-14 12:49  Stool  small hard dark brown formed BM   Brief Assessment:  Cardiac Regular   Respiratory clear BS   Gastrointestinal details normal Soft  Nontender  Nondistended  Bowel sounds normal   Lab Results: LabUnknown:  08-Jul-14 08:05   Result Interpretation The peripheral smear shows changes similar to those seen in smear reviewed from 06/29/13. There is a hypochromic micro- cytic anemia and post-transfusion changes. No schistocytes are identified. Dr. Luana Shu  Routine BB:  08-Jul-14 10:02   Crossmatch Unit 1 Issued (Result(s) reported on 30 Jun 2013 at 03:24PM.)  Routine Chem:  07-Jul-14 14:13   Iron Binding Capacity (TIBC)  506  Iron Saturation 2 (Result(s) reported on 29 Jun 2013 at 04:21PM.)  Ferritin [facility])  4 (Result(s) reported on 29 Jun 2013 at 04:34PM.)  08-Jul-14 08:05   Glucose, Serum 82  BUN 10  Creatinine (comp)  0.42  Sodium, Serum 140  Potassium, Serum 3.5  Chloride, Serum 107  CO2, Serum 25  Calcium (Total), Serum 8.5  Anion Gap 8  Osmolality (calc) 278  eGFR (African American) >60  eGFR (Non-African American) >60 (eGFR values <28m/min/1.73 m2 may be an indication of chronic kidney disease (CKD). Calculated eGFR is useful in patients with stable renal function. The eGFR calculation will not be reliable in acutely ill patients when serum creatinine is changing rapidly. It is not useful in  patients on dialysis.  The eGFR calculation may not be applicable to patients at the low and high extremes of body sizes, pregnant women, and vegetarians.)  Routine Coag:  08-Jul-14 08:05   Prothrombin  24.6  INR 2.3 (INR reference interval applies to patients on anticoagulant therapy. A single INR therapeutic range for coumarins is not optimal for all indications; however, the suggested range for most indications is 2.0 - 3.0. Exceptions to the INR Reference Range may include: Prosthetic heart valves, acute myocardial infarction, prevention of myocardial infarction, and combinations of aspirin and anticoagulant. The need for a higher or lower target INR must be assessed individually. Reference: The Pharmacology and Management of the Vitamin K  antagonists: the seventh ACCP Conference on Antithrombotic and Thrombolytic Therapy. CZDGUY.4034Sept:126 (3suppl): [id number] A HCT value >55% may artifactually increase the PT.  In one study,  the increase was an average of 25%. Reference:  "Effect on Routine and Special Coagulation Testing Values of Citrate Anticoagulant Adjustment in Patients with High HCT Values." American Journal of Clinical Pathology 2006;126:400-405.)  Routine Hem:  08-Jul-14 08:05   WBC (CBC) 3.6  RBC (CBC)  3.19  Hemoglobin (CBC)  6.0  Hematocrit (CBC)  19.9  Platelet Count (CBC) 186  MCV  62  MCH  18.9  MCHC  30.2  RDW  32.6  Neutrophil % 65.3  Lymphocyte % 24.9  Monocyte % 8.4  Eosinophil % 0.9  Basophil % 0.5  Neutrophil # 2.4  Lymphocyte #  0.9  Monocyte # 0.3  Eosinophil # 0.0  Basophil # 0.0 (Result(s) reported on 30 Jun 2013 at 09:10AM.)   Assessment/Plan:  Assessment/Plan:  Assessment 1) profound microcytic anemia, IDA.  Pathology review of peripheral smear shows no evidenceo fo schistocytes or mechanical injury to RBCs.  No historical evidence to support GI bleeding and heme negative on admission.   Plan 1) do recommend GI luminal evaluation with EGD and colonoscopy  once transfused to above 8.0 hgb.  Recommend hematology opinion/consult as well.  Following.  I have discussed the risks benefits and cmoplications of egd and colonoscopy to include not limited to bleeding infection perforation and sdatin and she wishes to proceed.  May be able to arrange for thursday depending on heme consult recs and hgb level.   Electronic Signatures: Loistine Simas (MD)  (Signed 08-Jul-14 18:25)  Authored: Chief Complaint, VITAL SIGNS/ANCILLARY NOTES, Brief Assessment, Lab Results, Assessment/Plan   Last Updated: 08-Jul-14 18:25 by Loistine Simas (MD)

## 2015-04-15 NOTE — H&P (Signed)
PATIENT NAME:  Michele Michele [name], Michele Michele [name] MR#:  [id number] DATE OF BIRTH:  1946-05-14  DATE OF ADMISSION:  06/29/2013  PRIMARY CARE PHYSICIAN: [name]   CHIEF COMPLAINT: Shortness of breath.   HISTORY OF PRESENT ILLNESS: This is Michele [name] 69 year old female who presents to the hospital with progressive shortness of breath, getting worse over the past 2 days, although she has been having it for about 2 weeks. She describes the shortness of breath more on exertion. She denies any chest pain but does admit to some chest heaviness over the past couple of days. She admits to also some nausea and Michele [name] few episodes of vomiting and has not been able to keep anything down now for the past 2 days. She denies any fevers or chills, any diarrhea. She does not notice any melena or hematochezia. The patient came to the ER and was noted to have Michele [name] serum hemoglobin of 3.4 with Michele [name] hematocrit of 13. The patient was also noted to have an INR of 7.6. Hospitalist services were contacted for further treatment and evaluation.   REVIEW OF SYSTEMS:   CONSTITUTIONAL: No documented fever. No weight gain, no weight loss. Positive generalized weakness and fatigue.  EYES: No blurry or double vision.  ENT: No tinnitus. No postnasal drip. No redness of the oropharynx.  RESPIRATORY: No cough, no wheeze. No hemoptysis. Positive dyspnea.  CARDIOVASCULAR: No chest pain, no orthopnea, no palpitations, no syncope.  GASTROINTESTINAL: Positive nausea. Positive vomiting. No diarrhea. No abdominal pain, no melena or hematochezia.  GENITOURINARY: No dysuria or hematuria.  ENDOCRINE: No polyuria or nocturia. No heat or cold intolerance.   HEMATOLOGIC: No anemia, no bruising, no bleeding.  INTEGUMENTARY: No rashes. No lesions.  MUSCULOSKELETAL: No arthritis. No swelling, no gout.  NEUROLOGIC: No numbness or tingling. No ataxia. No seizure-type activity.  PSYCHIATRIC: Positive anxiety. No ADD.   PAST MEDICAL HISTORY: Consistent  with: 1.  History of aortic valve replacement. It is Michele [name] metallic valve.   2.  GERD.  3.  Hypertension.  4.  Hyperlipidemia.  5.  Anxiety.  6.  Agoraphobia.    ALLERGIES: LASIX, TO WHICH SHE GETS Michele [name] RASH.   SOCIAL HISTORY: Used to be Michele [name] smoker, quit 20+ years ago, does have Michele [name] 30 pack-year smoking history. No alcohol abuse. No illicit drug abuse. Lives at home with her son.   FAMILY HISTORY: Mother died from complications of acute pancreatitis. Father died from complications of heart disease. He was also diabetic.   CURRENT MEDICATIONS: Aspirin 81 mg daily, Coumadin 2.5 mg on Monday, Wednesday, Friday and 5 mg on Tuesday, Thursday, Saturday and Sunday, Xanax 0.5 mg t.i.d., Zoloft mg daily, simvastatin 20 mg at bedtime, Nexium 40 mg daily, multivitamin daily, metoprolol tartrate 12.5 mg b.i.d., calcium with vitamin D 1 tab daily.   PHYSICAL EXAMINATION:  VITAL SIGNS: On admission, temperature is 98.5, pulse 110, respirations 24, blood pressure 118/73, sats 100% on 2 liters nasal cannula.  GENERAL: She is Michele [name] pale-appearing female but in no apparent distress.  HEENT: She is atraumatic, normocephalic. Her extraocular muscles are intact. Pupils are equal and reactive to light. Sclerae are anicteric. No conjunctival injection. No pharyngeal erythema.  NECK: Supple. No jugular venous distention. No bruits. No lymphadenopathy or thyromegaly.  HEART: Regular rate and rhythm. She does have metallic valve click that is heard. No rubs. No murmurs.  LUNGS: Clear to auscultation bilaterally. No rales, no rhonchi, no wheezes.  ABDOMEN: Soft, flat, nontender, nondistended. Has good bowel  sounds. No hepatosplenomegaly appreciated.  EXTREMITIES: No evidence of any cyanosis, clubbing or peripheral edema. Has +2 pedal and radial pulses bilaterally.  NEUROLOGICAL: The patient is alert, awake, and oriented x3 with no focal motor or sensory deficits appreciated bilaterally.  SKIN: Moist and warm with no rash  appreciated.  LYMPHATIC: There is no cervical or axillary lymphadenopathy.    LABORATORY AND RADIOLOGICAL DATA:  Showed Michele [name] serum glucose of 99 BUN 11, creatinine 0.4, sodium 141, potassium 3.5, chloride 108, bicarbonate 24. The patient's LFTs were within normal limits. Troponin less than 0.02. White cell count 2.3, hemoglobin 3.4, hematocrit 13, platelet count of 222, MCV of 52. The patient's INR is 7.6 with Michele [name] prothrombin time of 60.1.   The patient did have Michele [name] chest x-ray done which shows elevation of the posterior aspect of the left hemidiaphragm, otherwise an unremarkable study. The patient also had an x-ray of the left ankle which shows fractures in the distal tibia and fibula, possibly secondary to either Michele [name] bimalleolar ORIF or trimalleolar fracture with posterior displacement of the talus suggested.   ASSESSMENT AND PLAN: This is Michele [name] 69 year old female with history of aortic valve replacement, gastroesophageal reflux disease, hypertension, anxiety, agoraphobia, presents to the hospital due to shortness of breath and weakness and noted to be severely anemic.   1.  Shortness of breath:  This is likely secondary to Michele [name] severe microcytic anemia. She was noted to have Michele [name] hemoglobin of 3.7. For now, I will transfuse her 2 units of packed red blood cells, follow her hemoglobin and follow her clinically.  2.  Anemia: This is Michele [name] microcytic anemia. The source of the anemia is unclear but suspected to be Michele [name] slow GI bleed. Her rectal exam in the ER was actually negative. The patient cannot recall if she has been having any melena. For now, I will go ahead and transfuse her 2 units of packed red blood cells, follow serial hemoglobins.  I will check Michele [name] ferritin, B12, iron, folate and reticulocyte count. I will hold her Coumadin for now.  She has been given some vitamin K in the ER, and we will follow her INR closely.  3.  Acquired coagulopathy:  This is likely secondary to the Coumadin she is taking for aortic valve  replacement.  For now, I will hold her Coumadin as the INR is supratherapeutic and she is severely anemic. She has received some vitamin K in the ER.  I will follow her INR and let it drift down slowly.  4.  Hypertension: The patient is presently hemodynamically stable.  Continue her on metoprolol. 5.  Anxiety and agoraphobia:  Continue with her Xanax. 6.  Gastroesophageal reflux disease:  Continue IV Protonix twice daily.  7.  History of aortic valve replacement: Clinically, the patient is not in congestive heart failure. Her INR is supratherapeutic; therefore, her Coumadin is being held given her acute anemia and suspected gastrointestinal bleed for now.   CODE STATUS:  The patient is Michele [name] FULL CODE.     TIME SPENT WITH ADMISSION:  50 minutes.   ____________________________ Rolly Pancake. Cherlynn Kaiser, MD vjs:cb D: 06/29/2013 15:56:59 ET T: 06/29/2013 16:36:38 ET JOB#: [id number]  cc: Rolly Pancake. Cherlynn Kaiser, MD, <Dictator> Houston Siren MD ELECTRONICALLY SIGNED 06/29/2013 19:43

## 2015-04-15 NOTE — Consult Note (Signed)
Chief Complaint:  Subjective/Chief Complaint NO ACUTE COMPLAINTS, WHEN EXAMINED NO SOB, NO CP OR ABDO PAIN   VITAL SIGNS/ANCILLARY NOTES: **Vital Signs.:   14-Jul-14 19:55  Vital Signs Type Routine  Temperature Temperature (F) 98.2  Celsius 36.7  Temperature Source oral  Pulse Pulse 66  Respirations Respirations 20  Systolic BP Systolic BP 127  Diastolic BP (mmHg) Diastolic BP (mmHg) 79  Mean BP 95  Pulse Ox % Pulse Ox % 98  Pulse Ox Activity Level  At rest  Oxygen Delivery Room Air/ 21 %   Brief Assessment:  Respiratory normal resp effort  no use of accessory muscles   Gastrointestinal details normal Soft  Nontender   Additional Physical Exam PALLOR  ALERT AND COOPERATIVE NO BRUISING  NEURO GROSSLY NON FOCAL   Lab Results: Routine Chem:  14-Jul-14 04:34   Potassium, Serum  3.3 (Result(s) reported on 06 Jul 2013 at 04:49AM.)  Magnesium, Serum  1.7 (1.8-2.4 THERAPEUTIC RANGE: 4-7 mg/dL TOXIC: > 10 mg/dL  -----------------------)  Routine Coag:  14-Jul-14 04:34   Activated PTT (APTT)  101.0 (A HCT value >55% may artifactually increase the APTT. In one study, the increase was an average of 19%. Reference: "Effect on Routine and Special Coagulation Testing Values of Citrate Anticoagulant Adjustment in Patients with High HCT Values." American Journal of Clinical Pathology 2006;126:400-405.)  Prothrombin 13.8  INR 1.0 (INR reference interval applies to patients on anticoagulant therapy. A single INR therapeutic range for coumarins is not optimal for all indications; however, the suggested range for most indications is 2.0 - 3.0. Exceptions to the INR Reference Range may include: Prosthetic heart valves, acute myocardial infarction, prevention of myocardial infarction, and combinations of aspirin and anticoagulant. The need for a higher or lower target INR must be assessed individually. Reference: The Pharmacology and Management of the Vitamin K  antagonists: the seventh  ACCP Conference on Antithrombotic and Thrombolytic Therapy. Chest.2004 Sept:126 (3suppl): [id number]. A HCT value >55% may artifactually increase the PT.  In one study,  the increase was an average of 25%. Reference:  "Effect on Routine and Special Coagulation Testing Values of Citrate Anticoagulant Adjustment in Patients with High HCT Values." American Journal of Clinical Pathology 2006;126:400-405.)  Routine Hem:  14-Jul-14 04:34   WBC (CBC)  3.1  RBC (CBC) 3.87  Hemoglobin (CBC)  8.8  Hematocrit (CBC)  27.6  Platelet Count (CBC)  123 (Result(s) reported on 06 Jul 2013 at 04:49AM.)  MCV  71  MCH  22.7  MCHC  31.8  RDW  35.2  Segmented Neutrophils 55  Lymphocytes 33  Variant Lymphocytes 1  Monocytes 6  Eosinophil 5  Diff Comment 1 ANISOCYTOSIS  Diff Comment 2 POIKILOCYTOSIS  Diff Comment 3 HYPOCHROMIA  Diff Comment 4 LARGE PLATELETS  Diff Comment 5 PLTS VARIED IN SIZE  Diff Comment 6 DIMORPHIC RBCS  Result(s) reported on 06 Jul 2013 at 04:49AM.   Assessment/Plan:  Assessment/Plan:  Assessment ANEMIA, IRON DEFICIENT GI BLEED, HGB STABLE, HAS WAX AND WANE.  PLTS LOWER YESTERDAY AND TODAY, STABLE LAST 24 HRS, AND DROP IS PRIOR TO HEPARIN DRIP STARTED, POLYS ALSO BORDERLINE, LYMPHS BORDELINE   Plan F/U SERIAL CBC, PO IRON NO OTHER MEASURES AT THIS TIME   Electronic Signatures: Marin RobertsGittin, Robert G (MD)  (Signed 14-Jul-14 21:43)  Authored: Chief Complaint, VITAL SIGNS/ANCILLARY NOTES, Brief Assessment, Lab Results, Assessment/Plan   Last Updated: 14-Jul-14 21:43 by Marin RobertsGittin, Robert G (MD)

## 2015-04-15 NOTE — Consult Note (Signed)
PATIENT NAME:  Michele [name], Michele [name] MR#:  [id number] DATE OF BIRTH:  September 29, 1946  DATE OF CONSULTATION:  07/01/2013  CONSULTING PHYSICIAN:  Knute Neu. Gittin, MD  HISTORY OF PRESENT ILLNESS: Michele [name] is a 69 year old patient who was admitted on 07/07 as she present with progressive shortness of breath and was found to be severely anemic with a hemoglobin of 3.4. There was no active bleeding. She had had some nausea and some transient vomiting for a few days. She had not had any melena or hematochezia or any dark stools. In the Emergency Room, stool guaiac was negative. She also was a patient on Coumadin for mechanical heart valve, and the INR was 7.6. She admits being overdue to get back to her primary care doctor at [facility] to have the Coumadin checked. She had no acute complaints. No chest pain. She was admitted and transfused initially 2 units of blood.  It looks like she had 3 units of blood totally. As stated, the rectal exam was clinically negative. Iron studies were done.  Iron was 4% saturated, ferritin was 2, confirming iron deficiency. Coumadin was held. She was given some vitamin K in the Emergency Room, and the INR came down. She was noted to have hypertension but was stable. She was also noted to have an ankle fracture on x-ray.   PAST MEDICAL HISTORY:  1.  Aortic valve replacement, a metallic valve.  2.  GERD.  3.  Hypertension.  4.  Hyperlipidemia.  5.  Anxiety and agoraphobia.   ALLERGIES: LASIX APPARENTLY CAUSES A RASH.   SOCIAL HISTORY: She was a smoker but quit more than 20 years ago. As she remembers, she quit in 1986. No alcohol.   FAMILY HISTORY: Denied any malignancy in the family.   MEDICATIONS AT TIME OF ADMISSION:  Coumadin 2.5 mg on Mondays, Wednesdays, Fridays and 5 mg 4 days a week, aspirin 81 mg daily, Xanax 0.5 mg 3 times a day, Zoloft daily, simvastatin 20 mg at night, Nexium 40 mg daily, multivitamin daily, metoprolol 12.5 b.i.d., calcium with vitamin D daily.     PHYSICAL EXAMINATION: GENERAL: The patient has slight pallor, although clearly she had more pallor on admission prior to transfusions.  HEENT: Sclerae: No jaundice. Mouth:  No thrust.   LYMPH NODES: No palpable lymph nodes in the neck, supraclavicular, or submandibular.  LUNGS: Clear. No wheezing or rales.  HEART: Regular with metallic clicking heart valve.  ABDOMEN: Nontender. No palpable mass or organomegaly.  EXTREMITIES: No extremity edema.  NEUROLOGIC: Grossly nonfocal. Alert and cooperative. I did not test her gait.  Moving all extremities against gravity.   LABORATORY AND RADIOLOGICAL DATA:  On admission, troponin was low.  The INR was 7.6. The PTT was 56 seconds. White count was 2.3, hemoglobin was 3.4, platelets were 222. The MCV was 52. Creatinine was 0.47. Liver chemistries were normal. BNP was 667. Iron was 2% saturated. Vitamin B12 was 494.  Folic acid was 17.9,  a ferritin was 4. A pro time on 07/08 was 2.3 pro time, this morning INR was 1.1, hemoglobin today was 9.2. White count was 3.3, neutrophils 1.9, lymphocytes 1.0.   Chest x-ray showed elevation of the left hemidiaphragm. X-ray of the ankle left ankle showed a fracture with some displacement.   IMPRESSION: 1.  The patient has anemia, he is clearly iron deficient, probably a gastrointestinal source though no bleed, no history of bleed, no active bleed and no melena.  2.  Coagulopathy on admission from Coumadin that  has been reversed. The INR is down to 1.5 this morning.  Repeating now, it may be completely normal.  3.  The patient admits to being overdue for medical care, including not up-to-date with mammograms.  4.  Chest x-ray showed an elevated hemidiaphragm, often benign, could have been postop if the patient had appropriate history, but in the absence of any old films this looks like it would be an indication for at least a noncontrast CT of the chest to rule out mass that might be causing diaphragm elevation.  5.   The patient had a borderline transient leukopenia, borderline lymphopenia, probably spurious, should be rechecked. CBC has been repeated.  6.  No current suspicion of other hematologic disorder.     PLAN:   1.  With regard to anemia, when GI work-up was complete the patient should be started on iron and hemoglobin should be watched. If hemoglobin  reconstitutes, he should have serial hemoglobins with a history to watch for any drop. If hemoglobin does not reconstitute, should reconsult hematology for possible IV or to work up for other causes anemia.  2.  The patient is with elevated hemidiaphragm, should have old films compared or [facility] or else later is indication for chest CT.  3.  Patient is overdue.  Should be encouraged to get back with primary care and take serial routine mammograms in addition to normal monitoring  and will have to get back on a regular schedule monitoring the pro time.  4.  If you suspect anticoagulation, although aorta is a lower risk than low flow mitral valve, with a mechanical valve and in the absence of any bleeding or melena or guaiac positive stools, I thought we should restart therapeutic anticoagulation. I discussed this with Dr. Allena KatzPatel and with Dr. Bluford Kaufmannh  and with Dr. Lady GaryFath as well. Should check the repeat PT and PTT and then restart anticoagulation according to nomogram. Should stop the heparin about 6 hours before the procedure. GI should direct the stopping and restarting of heparin after the procedure, and then patient should get back on therapeutic Coumadin.  5.  After procedure is performed, the patient back on lower than prior dose of Coumadin, if the patient has delayed response because of the prior vitamin K and needs prolonged heparin or Lovenox, then the patient should immediately after discharge get back to primary care, or if that is not available, she could come to the [facility] for monitoring of Lovenox and Coumadin until she is therapeutic  and then released back to primary care.   Nothing else acutely from Hematology/Oncology, but if the CBC is abnormal, or there is persistent leukopenia or lymphopenia, or if there are issues in adjusting Coumadin, or if there are GI malignancy discovered in luminal studies, then would continue to follow for those additional problems.   ____________________________ Knute Neuobert G. Lorre NickGittin, MD rgg:cb D: 07/01/2013 21:12:40 ET T: 07/01/2013 22:39:17 ET JOB#: [id number]  cc: Knute Neuobert G. Lorre NickGittin, MD, <Dictator> Marin RobertsOBERT G GITTIN MD ELECTRONICALLY SIGNED 07/28/2013 9:33

## 2015-04-15 NOTE — Consult Note (Signed)
PATIENT NAME:  Michele [name], Michele [name] MR#:  [id number] DATE OF BIRTH:  Sep 17, 1946  DATE OF CONSULTATION:  06/29/2013  CONSULTING PHYSICIAN:  Christena DeemMartin U. Allexis Bordenave, MD  ATTENDING PHYSICIAN: Rolly PancakeVivek J. Cherlynn KaiserSainani, MD  REASON FOR CONSULTATION: Severe microcytic anemia.   HISTORY OF PRESENT ILLNESS: Ms. Michele Atesndrews is [name] 69 year old Caucasian female who presented to the Emergency Room earlier today with increasing shortness of breath and weakness for about 3 weeks. She was found to be profoundly anemic on evaluation with [name] marked microcytosis. She states she has been having some marked nausea on [name] daily basis over the period of the past 3 weeks with some occasional episodes of emesis. She has not been able to eat anything for the past several days. She does have [name] history of heartburn and some occasional dysphagia but does not regurgitate foods. She does have [name] history of gastroesophageal reflux, for which she has been taking up to 2 Nexiums on [name] daily basis for [name] long period of time. She did fall in March 2014 and did not seek care of [name] physician. On evaluation here, she states that she was found to have [name] broken ankle. She denies any history of peptic ulcer disease. She denies any history of previous colonoscopy. She states she has lost about 10 pounds over the past several weeks. She does take warfarin at home. She has [name] bowel movement nearly daily. She denies any black stools, blood in the stools or slimy stools. She does have [name] history of an artificial heart valve being done in 2004. She had [name] supratherapeutic INR when she came into the hospital.   PAST MEDICAL HISTORY: 1.  Aortic valve replacement consistent with possible [employer]. [employer]/mechanical valve.  2.  Gastroesophageal reflux.  3.  Anxiety with agoraphobia.  4.  Hyperlipidemia.  5.  Hypertension.   SOCIAL HISTORY: She is [name] previous smoker but quit many years ago, however, does have [name] 30 pack-year smoking history. No history of alcohol abuse or illicit drugs. She  does live with her son.   GASTROINTESTINAL FAMILY HISTORY: Negative for colorectal cancer, liver disease or ulcers. However, her mother died from problems associated with pancreatitis. She does not believe that it was pancreatic cancer.   REVIEW OF SYSTEMS: Ten systems reviewed per admission history and physical, agree with same. It is of note that the patient states that she is unable to go to [name] physician due to the severity of her agoraphobia and for the most part [facility].   ALLERGIES: THE PATIENT IS ALLERGIC TO LASIX.   OUTPATIENT MEDICATIONS: Include enteric-coated aspirin 81 mg, calcium 600 plus D, metoprolol 12.5 mg orally twice [name] day, multiple vitamin once [name] day, Nexium 40 mg twice [name] day, simvastatin 20 mg once daily, warfarin 2.5 mg once [name] day Monday, Wednesday, Friday and 5 mg once [name] day Tuesday, Thursday, Saturday and Sunday. She takes Xanax 0.5 mg t.i.d., Zoloft 300 mg once [name] day.   PHYSICAL EXAMINATION: VITAL SIGNS: Temperature is 98.5, pulse 99, respirations 19, blood pressure 135/73, pulse oximetry 100%.  GENERAL: She is [name] quite pale, 69 year old Caucasian female in no acute distress.  HEENT: Normocephalic, atraumatic. Eyes are anicteric. Profound paleness to the conjunctivae. Oropharynx: No lesions.  NECK: No JVD.  HEART: Regular rate and rhythm.  LUNGS: Clear.  ABDOMEN: Soft, nontender, nondistended. Bowel sounds positive, normoactive.  ANORECTAL: Examination done in the Emergency Room was Hemoccult negative. Again, the patient denies any previous  rectal bleeding, blood in the stools, black bowel movements or evidence of hematemesis.  EXTREMITIES: No clubbing, cyanosis or edema.  NEUROLOGICAL: Cranial nerves II through XII grossly intact. Muscle strength bilaterally equal and symmetric. Deep tendon reflexes hyperreflexic in the upper extremities and negative in the lower extremities.   LABORATORY AND RADIOLOGICAL DATA:  Include the following: On admission to the hospital, she had [name] glucose of 99. BNP of 667. Serum iron 8. BUN 11, creatinine 0.47, sodium 141, potassium 3.5. Folic acid 17.9. Chloride 108, bicarbonate 24. TIBC elevated at 506, iron saturation 2, ferritin low at 4. Her prothrombin time was 60.1 with an INR elevated at 7.6. Her hepatic profile was normal. She has had cardiac enzymes x 1, which were normal. Hemogram on admission showed [name] white count of 2.3, hemoglobin and hematocrit of 3.4 and 13.0, platelet count of 222, MCV 52. She has been typed and cross-matched and is currently undergoing transfusion. She had a PA and lateral chest film for her dyspnea showing elevation of posterior aspects of the left hemidiaphragm, otherwise unremarkable with postoperative changes from her previous cardiac valve replacement. On the PA and lateral chest film, there is [name] metallic annular ring. However, there is no cage structure on the valve. She had an ankle film done for ankle pain over the past 4 months, found to have fractures in the distal tibia and fibula, with orthopedic followup recommended.   ASSESSMENT: Profound microcytic anemia with iron deficiency. This is in the setting of mechanical heart valve as well as very poor medical followup due to the patient's agoraphobia. She is Hemoccult negative on examination. The CBC was done without pathology review in regards to whether there may be schistocytes or other evidence of traumatic injury to red blood cells.   RECOMMENDATIONS: Continue transfusion as you are. The patient will at some point need to have luminal evaluation via EGD and colonoscopy, if only for screening purposes. I would repeat [name] CBC, with pathology review, for evidence of injury to red blood cells. The patient does have some minimal history of dysphagia, however, has been taking Nexium twice [name] day. This can be further evaluated once her anemia is repleted. Will follow with you.   Thank you for this  consult.    ____________________________ Christena Deem, MD mus:jm D: 06/29/2013 19:55:02 ET T: 06/29/2013 20:27:29 ET JOB#: [id number]  cc: Christena Deem, MD, <Dictator> Christena Deem MD ELECTRONICALLY SIGNED 07/20/2013 7:15

## 2015-04-15 NOTE — Consult Note (Signed)
Chief Complaint:  Subjective/Chief Complaint Patietn was seen prior to the proceedure, note didt register?.  tolerated prep for colonoscopy, no n/v or abdominal pain.  no bleeding noted in prep.   VITAL SIGNS/ANCILLARY NOTES: **Vital Signs.:   10-Jul-14 08:00  Vital Signs Type Routine  Temperature Temperature (F) 97.4  Celsius 36.3  Temperature Source oral  Pulse Pulse 66  Respirations Respirations 16  Systolic BP Systolic BP 703  Diastolic BP (mmHg) Diastolic BP (mmHg) 62  Mean BP 82  Pulse Ox % Pulse Ox % 97  Pulse Ox Activity Level  At rest  Oxygen Delivery Room Air/ 21 %   Brief Assessment:  Cardiac Regular   Respiratory clear BS   Gastrointestinal details normal Soft  Nontender  Nondistended  No masses palpable  Bowel sounds normal   Lab Results: Routine Chem:  10-Jul-14 01:19   Result Comment APTT - NOTIFIED OF CRITICAL VALUE  - RESULTS VERIFIED BY REPEAT TESTING.  - CALLED TO MARGARET JAMES:07/02/13 AT 0215  - READ-BACK PROCESS PERFORMED.  - TPL  Result(s) reported on 02 Jul 2013 at 02:14AM.    08:20   Glucose, Serum 82  BUN 7  Creatinine (comp)  0.59  Sodium, Serum 139  Potassium, Serum 3.7  Chloride, Serum 105  CO2, Serum 26  Calcium (Total), Serum 9.1  Anion Gap 8  Osmolality (calc) 275  eGFR (African American) >60  eGFR (Non-African American) >60 (eGFR values <67m/min/1.73 m2 may be an indication of chronic kidney disease (CKD). Calculated eGFR is useful in patients with stable renal function. The eGFR calculation will not be reliable in acutely ill patients when serum creatinine is changing rapidly. It is not useful in  patients on dialysis. The eGFR calculation may not be applicable to patients at the low and high extremes of body sizes, pregnant women, and vegetarians.)  Routine Coag:  10-Jul-14 01:19   Activated PTT (APTT)  > 160.0 (A HCT value >55% may artifactually increase the APTT. In one study, the increase was an average of  19%. Reference: "Effect on Routine and Special Coagulation Testing Values of Citrate Anticoagulant Adjustment in Patients with High HCT Values." American Journal of Clinical Pathology 2006;126:400-405.)    08:20   Activated PTT (APTT) 28.1 (A HCT value >55% may artifactually increase the APTT. In one study, the increase was an average of 19%. Reference: "Effect on Routine and Special Coagulation Testing Values of Citrate Anticoagulant Adjustment in Patients with High HCT Values." American Journal of Clinical Pathology 2006;126:400-405.)  Routine Hem:  10-Jul-14 08:20   WBC (CBC)  3.0  RBC (CBC) 5.10  Hemoglobin (CBC)  11.4  Hematocrit (CBC) 36.1  Platelet Count (CBC) 215  MCV  71  MCH  22.4  MCHC  31.7  RDW  35.8  Neutrophil % 56.5  Lymphocyte % 29.0  Monocyte % 6.4  Eosinophil % 6.2  Basophil % 1.9  Neutrophil # 1.7  Lymphocyte #  0.9  Monocyte # 0.2  Eosinophil # 0.2  Basophil # 0.1 (Result(s) reported on 02 Jul 2013 at 08:37AM.)   Assessment/Plan:  Assessment/Plan:  Assessment 1) marked microcytic anemia of uncertain etiology, heme negative on admission.  tolerated multiple tfx.   Plan 1) will proceed with colonoscopy and egd.  I have discussed the risks benefits and complications of egd and colonoscopy to include not limited to bleeding infection perforation and sedation and she wishes to  proceed.   Electronic Signatures: SLoistine Simas(MD)  (Signed 10-Jul-14 15:53)  Authored: Chief Complaint,  VITAL SIGNS/ANCILLARY NOTES, Brief Assessment, Lab Results, Assessment/Plan   Last Updated: 10-Jul-14 15:53 by Loistine Simas (MD)

## 2015-04-15 NOTE — Consult Note (Signed)
Chief Complaint:  Subjective/Chief Complaint seen for profound IDA/microcytic anemia.  doing well s/p tfx.  denies n/v or abdominal pain.   VITAL SIGNS/ANCILLARY NOTES: **Vital Signs.:   09-Jul-14 07:55  Vital Signs Type Routine  Temperature Temperature (F) 98.1  Celsius 36.7  Temperature Source oral  Pulse Pulse 86  Respirations Respirations 18  Systolic BP Systolic BP 450  Diastolic BP (mmHg) Diastolic BP (mmHg) 68  Mean BP 82  Pulse Ox % Pulse Ox % 97  Pulse Ox Activity Level  At rest  Oxygen Delivery 1L   Brief Assessment:  Cardiac Regular   Respiratory clear BS   Gastrointestinal details normal Soft  Nontender  Nondistended  No masses palpable  Bowel sounds normal   Lab Results:  Routine Chem:  09-Jul-14 05:04   Glucose, Serum 96  BUN 9  Creatinine (comp)  0.53  Sodium, Serum 142  Potassium, Serum  3.2  Chloride, Serum  108  CO2, Serum 29  Calcium (Total), Serum  8.3  Anion Gap  5  Osmolality (calc) 282  eGFR (African American) >60  eGFR (Non-African American) >60 (eGFR values <41m/min/1.73 m2 may be an indication of chronic kidney disease (CKD). Calculated eGFR is useful in patients with stable renal function. The eGFR calculation will not be reliable in acutely ill patients when serum creatinine is changing rapidly. It is not useful in  patients on dialysis. The eGFR calculation may not be applicable to patients at the low and high extremes of body sizes, pregnant women, and vegetarians.)  Routine Coag:  09-Jul-14 05:04   Prothrombin  17.8  INR 1.5 (INR reference interval applies to patients on anticoagulant therapy. A single INR therapeutic range for coumarins is not optimal for all indications; however, the suggested range for most indications is 2.0 - 3.0. Exceptions to the INR Reference Range may include: Prosthetic heart valves, acute myocardial infarction, prevention of myocardial infarction, and combinations of aspirin and anticoagulant. The  need for a higher or lower target INR must be assessed individually. Reference: The Pharmacology and Management of the Vitamin K  antagonists: the seventh ACCP Conference on Antithrombotic and Thrombolytic Therapy. CTUUEK.8003Sept:126 (3suppl): [id number] A HCT value >55% may artifactually increase the PT.  In one study,  the increase was an average of 25%. Reference:  "Effect on Routine and Special Coagulation Testing Values of Citrate Anticoagulant Adjustment in Patients with High HCT Values." American Journal of Clinical Pathology 2006;126:400-405.)  Routine Hem:  07-Jul-14 14:13   Hemoglobin (CBC)  3.4  Platelet Count (CBC) 222  08-Jul-14 08:05   Hemoglobin (CBC)  6.0  Platelet Count (CBC) 186  09-Jul-14 05:04   WBC (CBC)  3.3  RBC (CBC) 4.13  Hemoglobin (CBC)  9.2  Hematocrit (CBC)  28.6  Platelet Count (CBC) 177  MCV  69  MCH  22.2  MCHC 32.1  RDW  35.0  Neutrophil % 57.4  Lymphocyte % 29.3  Monocyte % 9.1  Eosinophil % 3.6  Basophil % 0.6  Neutrophil # 1.9  Lymphocyte # 1.0  Monocyte # 0.3  Eosinophil # 0.1  Basophil # 0.0 (Result(s) reported on 01 Jul 2013 at 05:55AM.)   Assessment/Plan:  Assessment/Plan:  Assessment 1) microcytic anemia/iron deficiency in the setting of heme negative stool 2) history of artificial heart valve   Plan 1) will proceed with egd and colonoscopy tomorrow pm.  I have discussed the risks benefits and complications of colonoscopy and egd to include not limited to bleeding infection perforation and sedation and  she wishes to proceed.  Will need antibiotic coverage on call to endoscopy due to artificial heart valve.   Electronic Signatures for Addendum Section:  Loistine Simas (MD) (Signed Addendum 09-Jul-14 17:53)  I have reviewed current recommendations in regard to antibiotic recommendations for GI proceedures with artificial heart valves--"Antibiotic prophylaxis solely to prevent IE is no longer recommended for patients who undergo  a GI or GU tract proceedure, including patients with the highest risk of adverse outcomes due to IE." American Heart Association 2008.   Electronic Signatures: Loistine Simas (MD)  (Signed 09-Jul-14 17:09)  Authored: Chief Complaint, VITAL SIGNS/ANCILLARY NOTES, Brief Assessment, Lab Results, Assessment/Plan   Last Updated: 09-Jul-14 17:53 by Loistine Simas (MD)

## 2015-04-15 NOTE — Discharge Summary (Signed)
PATIENT NAME:  Michele [name], Michele [name] MR#:  [id number] DATE OF BIRTH:  03/04/1946  DATE OF ADMISSION:  06/29/2013 DATE OF DISCHARGE:  07/14/2013  Please refer to interim discharge summary dictated by Dr. Katharina Caper on 07/07/2013 for more details on the first part of the admission. This dictation covers the days from the 16th up to the 22nd.   DISPOSITION: Home with home health.   FOLLOWUP: Dr. Willette Alma in [place] in 1 to 2 weeks.   MEDICATIONS AT DISCHARGE:  1. Xanax 0.5 mg 3 times daily. 2. Zoloft 300 mg once a day.  3. Metoprolol 12.5 mg twice daily.  4. Calcium plus vitamin D 600 mg once a day.  5. Multivitamins once daily. 6. Nexium 40 mg 2 times a day.  7. Simvastatin 20 mg at bedtime.  8. Warfarin take 5 mg on Tuesday, Thursday, Saturday, Sunday and take 7.5 mg on Monday, Wednesday, Friday.  9. Aspirin 81 mg once a day, hold until 07/24/2013. 10. Niferex 150 mg twice daily. 11. Amoxicillin 500 mg 3 times a day for 5 more days.   ADMITTING DIAGNOSES:  1. Shortness of breath.  2. Anemia.  3. Acquired coagulopathy.  4. Hypertension.  5. Anxiety with agoraphobia.  6. Gastroesophageal reflux disease. 7. Aortic valve replacement.   DISCHARGE DIAGNOSES: 1. Chronic obstructive pulmonary disease exacerbation.  2. 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  3. Pancytopenia.  5. Acquired coagulopathy.  6. Hypertension.  7. Anxiety with agoraphobia.  8. Gastroesophageal reflux disease. 9. Aortic valve replacement.   CONSULTANTS: Dr. Lorre Nick and Dr. Barnetta Chapel.   PROCEDURES: EGD on 07/02/2013, colonoscopy 07/02/2013 day. The GI endoscopy performed by Dr. Marva Panda showed erosive gastropathy, normal duodenum, normal esophagus. The colonoscopy also by Dr. Marva Panda showed multiple polyps in the colon with diverticulosis. Polyps were resected and retrieved.   QUICK SUMMARY OF ADMISSION AND PREVIOUS DAYS OF  HOSPITALIZATION: This is a 69 year old female who has history of aortic valve replacement, on Coumadin therapy, who presented to the Emergency Department on 06/29/2013 with a history of shortness of breath. At that moment, she was afebrile, but tachycardic and tachypneic. Her oxygen saturations were 100% on 3 liters nasal cannula. The patient had no major problems before this. It was just a history of increasing shortness of breath within 3 to 4 days prior to admission. Apparently, her hemoglobin on admission was 3.4, and her hematocrit was 13, her platelet count was 222, and her white cells were 2.3. The patient had severe iron deficiency, likely due to GI bleeding. Her reticulocyte count was 56. Her EKG showed sinus tachycardia with LVH. The patient was admitted for evaluation of this condition. The patient received proton pump inhibitor, multiple transfusions, until her hemoglobin went up to 12 on 07/01/2013. The hemoglobin started drifting down to 8 up to July 2014, for which GI was consulted and hematology/oncology was consulted. The patient was taken off Coumadin and started on heparin. After the GI procedures were done and her polyps were removed and there were no other signs of bleeding, her coagulation was restarted, bridging with heparin and Coumadin.   The last days of hospitalization were mostly waiting for her INR to be therapeutic. She cannot stop her anticoagulation due to mechanical valve replacement for which she needs to be observed very closely.   The patient has severe agoraphobia, and she does not visit her doctor. She does not take care of appointments to check her INR,  for which, at the end of this admission, I recommend her to go home with home health. Home health could go to the house and check her INR periodically. That way, she can have a point of contact with Dr. [name], and this way, we can monitor her blood levels and also monitor her hemoglobin and make sure it is not starting to  drift down again. The patient has been educated about the need of being in contact with her physician, especially if she has any melena or hematochezia.   As far as her other medical problems:  COPD. The patient is a former smoker. She was put on DuoNebs, and then she was on antibiotics. For the last 6 days, she has been on amoxicillin, and she is discharged with a prescription for amoxicillin to complete a course of 14 days.   Her anemia was due to severe iron deficiency. As mentioned above, she had an EGD and a colonoscopy. She has had some interm bleeding, and since hemoglobin is stable, she has continued to have the heparin bridge with Coumadin. Her INR at discharge is 2.0. We think that this is a good point to stop the heparin and continue Coumadin. Her target INR is close to 2.5. We are trying not to go to the higher end of the goal INR because of possible GI bleeding.   As far as her hypertension, it remained stable on metoprolol.  For anxiety and agoraphobia, the patient needs to continue to follow with outpatient psychiatry and outpatient primary care, and we are arranging home health to prevent the patient from not getting a check of her INR as the patient is pretty much housebound due to her psychiatric problem.   We will continue to give Protonix twice daily for the time being and diet recommendations.   As far as her aortic valve replacement, the patient needs to follow up with her primary care physician and check her INR levels periodically.   TIME SPENT: I spent about 60 minutes with this patient's discharge. Please also see a copy of the discharge summary dictated by Dr. Katharina Caperima Vaickute.  ____________________________ Felipa Furnaceoberto Sanchez Gutierrez, MD rsg:OSi D: 07/15/2013 06:55:23 ET T: 07/15/2013 07:49:54 ET JOB#: [id number]  cc: Felipa Furnaceoberto Sanchez Gutierrez, MD, <Dictator> Willette AlmaMarco Aleman, MD, [facility] [name] MD ELECTRONICALLY SIGNED 07/17/2013 20:48

## 2015-04-15 NOTE — Consult Note (Signed)
Chief Complaint:  Subjective/Chief Complaint seen for marked IDA.  denies abdominal pain, n or vomiting.  no bm since yesterday.   VITAL SIGNS/ANCILLARY NOTES: **Vital Signs.:   11-Jul-14 07:33  Vital Signs Type Routine  Temperature Temperature (F) 98.4  Celsius 36.8  Temperature Source oral  Pulse Pulse 69  Respirations Respirations 18  Systolic BP Systolic BP 818  Diastolic BP (mmHg) Diastolic BP (mmHg) 68  Mean BP 83  Pulse Ox % Pulse Ox % 93  Pulse Ox Activity Level  At rest  Oxygen Delivery Room Air/ 21 %   Brief Assessment:  Cardiac Regular   Respiratory normal resp effort   Gastrointestinal details normal Soft  Nontender  Nondistended  Bowel sounds normal   Lab Results: Routine Chem:  11-Jul-14 05:13   Glucose, Serum 79  BUN 7  Creatinine (comp)  0.50  Sodium, Serum 140  Potassium, Serum  3.4  Chloride, Serum 106  CO2, Serum 27  Calcium (Total), Serum  8.3  Anion Gap 7  Osmolality (calc) 276  eGFR (African American) >60  eGFR (Non-African American) >60 (eGFR values <66m/min/1.73 m2 may be an indication of chronic kidney disease (CKD). Calculated eGFR is useful in patients with stable renal function. The eGFR calculation will not be reliable in acutely ill patients when serum creatinine is changing rapidly. It is not useful in  patients on dialysis. The eGFR calculation may not be applicable to patients at the low and high extremes of body sizes, pregnant women, and vegetarians.)  Routine Hem:  11-Jul-14 05:13   WBC (CBC)  3.4  RBC (CBC) 3.82  Hemoglobin (CBC)  8.6  Hematocrit (CBC)  26.8  Platelet Count (CBC) 153  MCV  70  MCH  22.5  MCHC 32.1  RDW  35.5  Neutrophil % 66.5  Lymphocyte % 24.4  Monocyte % 5.6  Eosinophil % 2.3  Basophil % 1.2  Neutrophil # 2.2  Lymphocyte #  0.8  Monocyte # 0.2  Eosinophil # 0.1  Basophil # 0.0 (Result(s) reported on 03 Jul 2013 at 06:03AM.)   Assessment/Plan:  Assessment/Plan:  Assessment 1)  IDA/microcytic anemia-colonoscopy yesterday showing multiple significant polyps removed.  egd otherwise normal .  2) artificial heart valve-anticoagulation held for proceedure, would hold for 2 more days then restart regular dosing.   3) agoraphobia-severe, affecting patients medical care   Plan 1) awaiting polypectomy results. Dr WAllen Norrisavailable over the weekend if needed, I will be back on monday.   Electronic Signatures: SLoistine Simas(MD)  (Signed 11-Jul-14 11:04)  Authored: Chief Complaint, VITAL SIGNS/ANCILLARY NOTES, Brief Assessment, Lab Results, Assessment/Plan   Last Updated: 11-Jul-14 11:04 by SLoistine Simas(MD)

## 2015-04-16 NOTE — Consult Note (Signed)
PATIENT NAME:  Michele Michele [name], Michele Michele [name] MR#:  [id number] DATE OF BIRTH:  1946/12/01  DATE OF CONSULTATION:  08/22/2014  REFERRING PHYSICIAN:   CONSULTING PHYSICIAN:  Sharon Rubis K. Tomasa Dobransky, MD  AGE: [age] years.  SEX: Female.  RACE: White.  SUBJECTIVE: The patient was seen in consultation, [facility], [facility] [place] [place]. The patient is Michele [name] 69 year old white female, not employed and worked for many years and has been on disability for Michele [name] few years for her nervousness and depression. The patient is divorced and lives by herself and her 70 year old son lives across from her. [facility] Michele [name] chief complaint "my back hurts."   HISTORY OF PRESENT ILLNESS: The patient reports that Michele [name] year ago she hurt 1 ankle and again this year she hurt another ankle and currently she has back problems and chronic pain.  PAST PSYCHIATRIC HISTORY: No history of inpatient psychiatry. No history of suicide attempts. Being followed by primary care physician also being evaluated by psychiatrist for followup of major depression, agoraphobia. The patient has been on the following medications: Zoloft 200 mg p.o. daily and Xanax 0.5 mg 4 times Michele [name] day. She gets these medications from [place], last appointment some time ago, next appointment is to be made.   ALCOHOL AND DRUGS: Denies drinking alcohol. Denies street or prescription drug abuse. Denies smoking nicotine cigarettes.  MENTAL STATUS: The patient is seen lying in bed with oxygen, alert and oriented, very slow to respond. Affect is flat and mood is depressed talking about 70 year old son who was diagnosed colon cancer stage 4. She became teary eyed while talking about her son's cancer. No psychosis. Cognition intact. General information fair. Insight and judgment fair. Impulse control is fair. She does admit feeling low and down, depressed because of family-related stressors, but contracts for safety.  IMPRESSION: Major  depressive disorder, recurrent. Agoraphobia.  RECOMMENDATIONS: Continue her current medications, as for the Zoloft 20 mg p.o. daily with food and Xanax 0.5 mg p.o. q.i.d. Follow up with her primary care physician after discharge.   ____________________________ Jannet MantisSurya K. Guss Bundehalla, MD skc:TT D: 08/22/2014 17:24:37 ET T: 08/22/2014 19:42:45 ET JOB#: [id number]  cc: Monika SalkSurya K. Guss Bundehalla, MD, <Dictator> Beau FannySURYA K Mychelle Kendra MD ELECTRONICALLY SIGNED 08/25/2014 7:56

## 2015-04-16 NOTE — Discharge Summary (Signed)
PATIENT NAME:  Michele [name], Michele [name] MR#:  [id number] DATE OF BIRTH:  Oct 03, 1946  DATE OF ADMISSION:  07/17/2014 DATE OF DISCHARGE:  07/21/2014.  [facility].  REASON FOR ADMISSION:  Right ankle fracture due to fall.   CONSULTATIONS:  1.  Orthopedic consult with Dr. Mickle PlumbApel and Dr. Ernest PineHooten.  2.  Physical therapy consult.  3.  Cardiology consult.  HOSPITAL COURSE:   1.  [name] 69 year old female patient with hypertension, hyperlipidemia and [name] history of mechanical heart valve on warfarin comes in because of the fall and suffered [name] right  ankle trimalleolar fracture. The patient had [name] close reduction in the Emergency Room and was admitted to medical service because of medical problems. The patient admitted to medicine and had external fixation done, [name] fracture reduction and also external fixator on July 26. Postoperatively, the patient has significant pain and also limited physical therapy, so the patient seen in physical therapy, they recommended short-term rehabilitation. The patient  accepted bed offer  today at [facility] today and the patient will continue physical therapy. Continue Percocet for pain and see Dr. Ernest PineHooten in about 2 weeks.  2.  Mechanical aortic valve.  She is on Coumadin.  We did not have her on heparin and we continued the Coumadin and the patient's INR today is 2.8 and the patient advised to continue Coumadin.  3.  Hypertension controlled. 4.  Hyperlipidemia controlled. 5.  Agoraphobia and anxiety. Continue Xanax as needed.  6.  Chronic obstructive pulmonary disease. The patient does not have any wheezing currently.  Use p.r.n. nebulizers.  7.  The patient on Coumadin for mechanical aortic valve.  The patient needs to have INR between 2.5 to 3.5.   PHYSICAL EXAMINATION: VITAL SIGNS:  Temperature 98.4, heart rate is [name] 74, blood pressure is 134/80, saturations 98% on room air. The patient went to rehabilitation.   TIME SPENT ON DISCHARGE PREPARATION: More  than 30 minutes. The patient's  and she will see Dr. Ernest PineHooten in about 1-2 weeks    ____________________________ Katha HammingSnehalatha Alcario Tinkey, MD sk:ds D: 07/21/2014 12:58:09 ET T: 07/21/2014 14:14:23 ET JOB#: [id number]  cc: Katha HammingSnehalatha Migel Hannis, MD, <Dictator> Katha HammingSNEHALATHA August Longest MD ELECTRONICALLY SIGNED 07/30/2014 17:58

## 2015-04-16 NOTE — Discharge Summary (Signed)
PATIENT NAME:  Michele [name], Michele [name] MR#:  [id number] DATE OF BIRTH:  1946/08/09  DATE OF ADMISSION:  08/16/2014 DATE OF DISCHARGE:  08/29/2014  PRIMARY CARE PHYSICIAN: [facility].  REASON FOR ADMISSION: Intractable lower back pain.   DISCHARGE DIAGNOSES:  1.  Sepsis. 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  2.  Acute respiratory failure.  3.  Coagulopathy.  4.  History of aortic valve replacement with mechanical valve.  5.  Hypertension.  6.  Anemia.  7.  Hyperlipidemia.  8.  Depression.   CONDITION: Stable.   CODE STATUS: Full code.   HOME MEDICATIONS: Please refer to the medication reconciliation list.   DIET: Low sodium diet.   ACTIVITY: As tolerated.   FOLLOWUP CARE:  1.  Follow up with PCP and Dr. Sampson Goon within 1-2 weeks. In addition, the patient needs follow up vancomycin trough tonight, creatinine tomorrow.  The patient needs to hold Coumadin today due to elevated INR at 4.0. The patient needs to check INR tomorrow. Also, patient needs to monitor weekly CBC, creatinine and vancomycin trough. Call hospital pharmacy for further recommendation.  2.  Also follow up with Dr. Rosita Kea as outpatient within two weeks.   HOSPITAL COURSE:  The patient is a 69 year old Caucasian female with a history of aortic valve replacement, hypertension, hyperlipidemia, who was sent to ED due to intractable lower back pain. The patient denied any fall or head trauma. In the ED she was noted to be tachycardic and hypoxic. Her heart rate was in the 130s. She had a V/Q scan done, which was low probability for PE. The patient was on Coumadin for mechanical heart valve with a therapeutic INR at 2.4.  For detailed history and physical examination please refer to the admission note dictated by Dr. Randol Kern.  On date of admission laboratory data showed BUN 13, creatinine 0.43, sodium 140, potassium  3.5, chloride 106. Troponin less than 0.02, WBC 6, hemoglobin 12, INR 2.4.   1.  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 After admission, the patient's blood culture showed MRSA. Infectious disease physician, Dr. Sampson Goon, evaluated the patient and suggested starting vancomycin, gentamicin and rifampin. 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  The patient's TEE was negative for vegetation. The patient's blood culture was positive for MRSA.  Urine culture is positive for Enterococcus. Dr. Sampson Goon suggested vancomycin for 6 weeks, gentamicin for 2 weeks until September 9. In addition, the patient needs rifampin for 6 weeks. The patient needs to monitor creatinine and a vancomycin trough level.  2.  Acute respiratory failure, possibly due to sepsis. The patient was noted to have hypoxic respiratory failure, was treated with oxygen and nebulizer treatment. Symptoms have much improved, but the patient still complains of shortness of breath and anxiety. Oxygen saturation is stable at about 96-98%.  Shortness of breath is possibly due to anxiety.  3.  Urinary retention. The patient's CAT scan shows some bladder distention with hydroureter and hydronephrosis. The patient had a Foley catheter placed and then repeat kidney ultrasound did not show any hydronephrosis. Foley catheter was discontinued.  4.  Coagulopathy. The patient was on Coumadin but since the patient's INR was elevated up to 11.6, the Coumadin was on hold. The patient was treated with vitamin K, 3 mg p.o. once, however, after vitamin K treatment the patient's INR decreased to 1.1, so we started a heparin drip and  treated with Coumadin for the past several days. The patient's INR increased to 2.4. Heparin drip was discontinued. The patient's INR increased to 4.0 today. We will hold Coumadin today and follow up  INR tomorrow in the skilled nursing facility.  5.  Anemia, stable.  6.  Depression. The patient has depression due to her son's medical condition. The patient has been treated with the Zoloft and Xanax p.r.n.  Dr. Guss Bundehalla evaluated the patient, suggests continuing with medication. The patient denies any depression now.   7.  The patient needs long term IV antibiotics. She got a PICC line for IV antibiotics. She will be discharged to a skilled nursing facility today.   I discussed the patient's discharge plan with the patient, nurse, social worker.   TIME SPENT: About 43 minutes.    ____________________________ Shaune PollackQing Abad Manard, MD qc:lt D: 08/29/2014 11:40:47 ET T: 08/29/2014 12:19:32 ET JOB#: [id number]  cc: Shaune PollackQing Jibreel Fedewa, MD, <Dictator> Shaune PollackQING Jacobi Ryant MD ELECTRONICALLY SIGNED 08/29/2014 13:37

## 2015-04-16 NOTE — H&P (Signed)
PATIENT NAME:  Michele [name], Michele [name] MR#:  [id number] DATE OF BIRTH:  Dec 10, 1946  DATE OF ADMISSION:  08/16/2014  REFERRING PHYSICIAN: Bobetta LimeEryka [name]. Inocencio HomesGayle, MD  PRIMARY CARE PHYSICIAN: [facility].   CHIEF COMPLAINT: Intractable lower back pain.   HISTORY OF PRESENT ILLNESS: This is [name] 69 year old female with known history of hypertension, hyperlipidemia, mechanical heart valve on warfarin, recent admission on July 25th for right ankle fracture status post external fixation, history of COPD in the past as well. The patient presents with complaints of lower back pain. From the patient's report, she was trying to move the bed to transfer herself from the bed wheelchair, where she felt something pop in her back, and she had severe pain, which prompted her to come to the ED. The patient denies any fall or head trauma. In the ED, the patient required multiple IV pain medicines for her lower back pain. Also, she appears to be extremely anxious, especially as she ran out of her Zoloft. The patient was noticed to be tachycardic and hypoxic in the ED. Her tachycardia was in the 130s and 120s which did not improve with IV fluid boluses. As well, she reports she has not been taking any of her medication over the last 48 hours, as she has not been feeling well.  was noticed to be hypoxic in the ED, where she required oxygen. There was [name] concern about having PE, given her hypoxia and tachycardia, especially with recent immobilization due to her ankle fracture. The patient had V/Q scan done which was low probability for PE. As well, she is on warfarin for [name] mechanical heart valve with therapeutic INR of 2.4. Hospitalist was requested to admit the patient for pain control for her lower back pain.   PAST MEDICAL HISTORY: 1. History of falls.  2. History of aortic valve replacement with metallic valve.  3. Mechanical falls.  4. GERD. 5. Hypertension.  6. Hyperlipidemia.  7. Anxiety.  8. Agoraphobia.    ALLERGIES: LASIX,  TO WHICH SHE GETS RASH.   SOCIAL HISTORY: The patient quit smoking 20 years ago, but she used to be [name] heavy smoker, does have 30 pack-year history of smoking. No alcohol. No illicit drug use. Lives at home by herself. Son lives close to her and helps take care of her.   FAMILY HISTORY: Significant for acute pancreatitis in her mother. Father died of complications of heart disease as well as history of diabetes.   HOME MEDICATIONS: 1. Metoprolol 12.5 mg oral b.i.d.  2. Warfarin 5 mg on Tuesday; 7.5 mg every day but Tuesday.  3. Zoloft 200 mg oral daily, which she has run out of for [name] few days.  4. Simvastatin 20 mg oral daily. 5. Xanax 0.5 mg as needed.  6. Nexium 40 mg b.i.d.  7. DuoNebs q. 6 hours.  8. Docusate sodium 100 mg 2 times [name] day as needed.   REVIEW OF SYSTEMS:  CONSULTATION: Denies fever, chills. Reports fatigue, weakness. Denies weight gain, weight loss.  EYES: Denies blurred vision, double vision, inflammation.  EARS, NOSE, THROAT: Denies tinnitus, ear loss, epistaxis or discharge.  RESPIRATORY: Denies cough, wheezing, hemoptysis, dyspnea.  CARDIOVASCULAR: Denies chest pain, edema, palpitation, syncope.  GASTROINTESTINAL: Denies nausea, vomiting, diarrhea, abdominal pain, hematemesis, melena, diarrhea, constipation.  GENITOURINARY: Denies dysuria, hematuria, hematuria, renal colic.  ENDOCRINE: Denies polyuria, polydipsia, heat or cold intolerance.  HEMATOLOGIC: Denies any anemia. Reports history of easy bruising.  INTEGUMENT: Denies acne, rash, or skin lesion.  MUSCULOSKELETAL: Reports  lower back pain. Denies any cramps, swelling, gout. Currently, the patient is immobile in [name] wheelchair, given her recent external fixation of the right ankle fracture.  NEUROLOGIC: Denies any history of CVA, TIA, migraine, tremors, vertigo, ataxia.  PSYCHIATRIC: Reports history of anxiety. Denies insomnia or depression.   PHYSICAL EXAMINATION: VITAL SIGNS: Temperature 99.1, pulse 122,  respiratory rate 24, blood pressure 154/77. Saturating 100% on 2 liters nasal cannula.  GENERAL: Well-nourished female in mild distress due to pain.  HEENT: Head atraumatic, normocephalic. Pupils equal, reactive to light. Pink conjunctivae. Anicteric sclerae. Moist oral mucosa. No oral thrush.  NECK: Supple. No thyromegaly. No JVD. No carotid bruits. No cervical lymphadenopathy.  CHEST: Good air entry bilaterally. No wheezing, rales, rhonchi, use of accessory muscles.  CARDIOVASCULAR: S1, S2 heard. No rubs, murmur. No rubs, murmur, or gallops. Has systolic click secondary to mechanical valve. Tachycardic but regular.  ABDOMEN: Soft, nontender, nondistended. Bowel sounds present.  EXTREMITIES: No edema. No clubbing. No cyanosis. Pedal pulses felt bilaterally. Has external fixation hardware around the right ankle.  MUSCULOSKELETAL: No joint effusion or erythema. Has significant tenderness to palpation mainly in the right lower back,  LYMPHATICS: No cervical or supraclavicular lymphadenopathy.  SKIN: Normal skin turgor. Warm and dry.   PERTINENT LABORATORY DATA: Glucose 111, BUN 13, creatinine 0.43, sodium 140, potassium 3.5, chloride 106, CO2 21. Troponin less than 0.02. White blood cells 6, hemoglobin 12, hematocrit 36.2, platelets 115,000, INR 2.4.   IMAGING: V/Q scan: Low probability for PE.  ASSESSMENT AND PLAN: 1. Intractable lower back pain. This is most likely due to muscle spasm from her sudden movement while trying to move to the wheelchair. The patient has intractable back pain requiring multiple IV pain medications, so she will be admitted for better pain control. Will keep her on p.r.n. morphine. Will add Flexeril for her as well and consult physical therapy.  2. Sinus tachycardia. The patient has stopped taking her metoprolol for 48 hours, so there is most likely [name] beta blocker withdrawal factor. As well, this is most likely due to her to pain. As well, the patient did not take Xanax as  she used to recently and she only took 1 pill yesterday, where she usually 4 per day, so anxiety may likely be contributing to it as well.   3. Hypoxia, mild. This is with possible underlying chronic obstructive pulmonary disease. Will continue her on DuoNebs every 6 hours. Will add p.r.n. albuterol. She might need to home oxygen upon discharge.  4. History of aortic valve replacement, mechanical valve. Will consult pharmacy to dose warfarin for target INR of 2.5 to 3.5.  5. Gastroesophageal reflux disease. Continue with proton pump inhibitor.  6. Hypertension. Blood pressure is acceptable. Will monitor. Currently not taking any medications.  7. Hyperlipidemia. Continue with statin.  8. Anxiety and depression. Continue with home medication including Zoloft and p.r.n. Xanax.  9. Deep vein thrombosis prophylaxis. The patient is on full dose anticoagulation with warfarin with therapeutic INR.   CODE STATUS: Full code.   TOTAL TIME SPENT ON ADMISSION AND PATIENT CARE: 55 minutes.    ____________________________ Starleen Arms, MD dse:lm D: 08/16/2014 00:26:49 ET T: 08/16/2014 04:24:02 ET JOB#: [id number]  cc: Starleen Arms, MD, <Dictator> Cella Cappello Teena Irani MD ELECTRONICALLY SIGNED 08/16/2014 6:50

## 2015-04-16 NOTE — Discharge Summary (Signed)
PATIENT NAME:  [name], Ilaria A MR#:  [id number] DATE OF BIRTH:  04-09-1946  DATE OF ADMISSION:  07/17/2014 DATE OF DISCHARGE:  07/21/2014  ADDENDUM:  The patient's discharge summary took more than 30 minutes.   ____________________________ Katha HammingSnehalatha Augusta Mirkin, MD sk:ds D: 07/21/2014 12:58:30 ET T: 07/21/2014 14:45:16 ET JOB#: [id number]  cc: Katha HammingSnehalatha Yonael Tulloch, MD, <Dictator> Katha HammingSNEHALATHA Amandeep Nesmith MD ELECTRONICALLY SIGNED 08/05/2014 23:47

## 2015-04-16 NOTE — Consult Note (Signed)
Brief Consult Note: Diagnosis: s/p external fixator for ankle fracture, celluliltis.   Patient was seen by consultant.   Recommend to proceed with surgery or procedure.   Orders entered.   Comments: local pin care orders placed. Some cellulitis around pin sites but not grossly infected. Hoping to keep fixator on another 3 weeks.  Electronic Signatures: Leitha SchullerMenz, Makayle Krahn J (MD)  (Signed 24-Aug-15 12:07)  Authored: Brief Consult Note   Last Updated: 24-Aug-15 12:07 by Leitha SchullerMenz, Patina Spanier J (MD)

## 2015-04-16 NOTE — Consult Note (Signed)
PATIENT NAME:  Michele [name], Michele [name] MR#:  [id number] DATE OF BIRTH:  Jun 03, 1946  DATE OF CONSULTATION:  08/17/2014  REFERRING PHYSICIAN:  Herschell Dimesichard J. Renae GlossWieting, MD CONSULTING PHYSICIAN:  Stann Mainlandavid P. Sampson GoonFitzgerald, MD  REASON FOR CONSULTATION: Fever, bacteremia.   HISTORY OF PRESENT ILLNESS: This is [name] very pleasant 69 year old female with [name] history of hypertension, hyperlipidemia, as well as [name] mechanical heart valve, and [name] history of COPD. She was admitted August 24 with severe low back pain after twisting her back while walking around with her walker. She came into the ED because of this and was found to be tachycardic and hypoxic. She had [name] workup for PE, which was negative. She was admitted for pain control. After admission, she was noted to have [name] high fever. Blood cultures are now growing 1 of 2 gram-positive cocci. Urine culture also has greater than 100,000 gram-positive cocci.   The patient reports that prior to this she had been living at home, getting around with her walker. She had not had any fevers or chills that she is aware of. She has not had any recent procedures except for the placement of an external fixator on her left leg following [name] fracture. This site had been draining some from the lower pin holes, but had not had marked swelling or redness around the leg. She denies having any fevers at home, although she says this time when she had [name] fever in the hospital she was not aware of it. Her back pain is slightly better since admission. She has had no rash. No other joint swelling or redness.   PAST MEDICAL HISTORY:  1.  Aortic valve replacement with mechanical valve.  2.  GERD. 3.  Hypertension.  4.  Hyperlipidemia.  5.  Anxiety.  6.  Agoraphobia. 7.  COPD. 8.  Recurrent falls.   PAST SURGICAL HISTORY:  1.  Placement of left external fixator in July 2015. 2.  Aortic valve replacement approximately 10 years ago.   SOCIAL HISTORY: Quit tobacco many years ago. No alcohol. Lives at home by  herself, but son lives close to her.   FAMILY HISTORY: Noncontributory.   ALLERGIES: SHE IS ALLERGIC TO LASIX WHICH SHE SAYS CAUSES [name] RASH.   REVIEW OF SYSTEMS: Eleven systems reviewed and negative except as per HPI.   ANTIBIOTICS SINCE ADMISSION: Initially she was started on ceftriaxone begun August 23, prior to blood cultures apparently. She then received vancomycin on August 23 and 24th.   PHYSICAL EXAMINATION:  VITAL SIGNS: Temperature 99, pulse 88, blood pressure 109/72, respirations 18, saturation 93% on 2 liters. Temperature max in the last 24 hours was 102.8 yesterday at 9:30 in the morning.  GENERAL: She is pleasant, elderly, lying in bed in no acute distress.  HEENT: Pupils equal, round, reactive to light and accommodation. Extraocular movements are intact. Sclerae are anicteric. Oropharynx was clear, but mucous membranes are dry.  NECK: Supple.  HEART: Regular with [name] mechanical heart valve closure sound and [name] 1/6 systolic murmur. LUNGS: Clear to auscultation bilaterally.  ABDOMEN: Soft, nontender, nondistended.  EXTREMITIES: She has an external fixator on her left leg. The pin sites have some crusting and mild surrounding erythema, but no florid infection.  NEUROLOGIC: She is alert and oriented x 3. Grossly nonfocal neurological examination.  SKIN: No peripheral stigmata of endocarditis.   DIAGNOSTIC DATA: Blood cultures 1 of 2 on August 24 are growing gram-positive cocci in clusters. Urine culture August 24 greater than 100,000 gram-positive cocci in clusters. Urinalysis  showed 10 white cells, 238 red cells. White blood count on admission was 6.0, currently it is 4.5, hemoglobin 10.3, platelets down to 83,000 from 115,000. Renal function was normal. Troponin was negative. INR 2.8. Of note, her platelets in July when she was admitted with fracture were also low at 123,000 and 122,000.   IMAGING: X-ray of her lumbar spine shows degenerative changes with no displaced fractures  identified. There is [name] suggestion of irregular endplates at L5-S1 with recommendation of MRI to exclude diskitis. Chest x-ray August 23 showed stably elevated left hemidiaphragm. Echocardiogram done August 25 reveals EF 55% to 60%, normal LV systolic function, mildly increased LV septal thickness, mild aortic regurgitation.   IMPRESSION: [name] 69 year old female with [name] prosthetic valve, as well as [name] recent placement of an external fixator following an ankle fracture in July. She was admitted with severe back pain, then found to have [name] fever. She has 1 of 2 blood cultures positive for gram-positive cocci as well as gram-positive cocci in her urine. She does not have [name] white count. X-ray of her spine is concerning possibly for diskitis and an MRI is recommended; however, given the prosthetic valve this may not be possible. I am concerned for endocarditis in the prosthetic valve. Potentially, she developed the cellulitis in the leg and then seeded her valve.   RECOMMENDATIONS:  1.  Continue vancomycin at this time.  2.  Continue ceftriaxone.  3.  I have repeated blood cultures x 2.  4.  Depending on the organisms isolated, she may need further evaluation with [name] TEE.   Thank you for the consult. I will be glad to follow with you.    ____________________________ Stann Mainland. Sampson Goon, MD dpf:TT D: 08/17/2014 13:58:28 ET T: 08/17/2014 14:41:43 ET JOB#: [id number]  cc: Stann Mainland. Sampson Goon, MD, <Dictator> DAVID Sampson Goon MD ELECTRONICALLY SIGNED 08/22/2014 22:25

## 2015-04-16 NOTE — Op Note (Signed)
PATIENT NAME:  Michele [name], Michele [name] MR#:  [id number] DATE OF BIRTH:  1946-11-03  DATE OF PROCEDURE:  07/18/2014  PREOPERATIVE DIAGNOSIS: Right trimalleolar ankle fracture, unstable.   POSTOPERATIVE DIAGNOSIS: Right trimalleolar ankle fracture, unstable.   PROCEDURES:   1. [name] closed reduction with manipulation of right trimalleolar ankle fracture using skeletal traction (external fixator). CPT code 8119127818.  2. Application of multiplane external fixator to the right lower extremity for stabilization of the trimalleolar ankle fracture. CPT code 4782920692.   SURGEON: Dr. Ruthe MannanPeter Jamarea Selner.   IMPLANTS: Synthes large external fixator.   SPECIMENS: None.   OPERATIVE FINDINGS:  There is anatomic reduction of the ankle mortise following the closed reduction and external fixation.   ANESTHETIC: General.   FLUIDS: Per anesthesia record.   ESTIMATED BLOOD LOSS: Minimal.   TOURNIQUET: Not utilized.   INDICATIONS: The patient is [name] 69 year old woman anticoagulated for [name] mechanical aortic valve who suffered an unstable trimalleolar ankle fracture. She failed [name] closed reduction in the Emergency Department with persistent subluxation of the ankle joint. Due to her massive swelling she was indicated for repeat closed reduction and placement of an external fixator.   DETAILED DESCRIPTION OF PROCEDURE: The patient was taken to the operating room in [name] supine position. Right lower extremity was prepped and draped in the usual sterile fashion [name] timeout was performed and antibiotics were given.   We started by placing the tibial pins in the shaft. Position was marked out using fluoroscopy. [name] 15 blade was used to incise the skin. Blunt dissection was taken down to the tibia. [name] 3.5-mm drill was then placed and the 5-mm Schanz pins were placed in the tibia. This was in the sagittal plane. Next, we used fluoroscopy to localize the start point for the calcaneal pin in the transverse plane. The pin was then passed in the transverse  plane. We then applied the pin-to-bar and bar-to-bar clamps from the tibial pins to the calcaneal pin. Longitudinal traction and anterior translation were then performed. The clamps were then tightened down. Position was checked in the AP and lateral fluoroscopic views. There was [name] successful reduction of the ankle and restoration of the mortise including the appropriate length of the fibula. The pin sites were then dressed. The patient was then awakened and taken to the recovery room in stable condition.   Note should be made that we attempted to place [name] first metatarsal pin. Following [name] pin placement, they were unable to connect this to the external fixer thus, the previous metatarsal pin was removed and [name] 4-0 Monocryl was used to close the pinhole.   POSTOPERATIVE PLAN:  1. The patient will be nonweightbearing to the right lower extremity.  2. She will start pin site care on postoperative day number 1. This should be [name] 50-50 mix of hydrogen peroxide and water. She may shower with the external fixator on. [facility] for re-evaluation of her skin and discussion of external fixation as definitive treatment versus conversion to internal fixation.    ____________________________ Ruthe MannanPeter Shaughnessy Gethers, MD pa:lt D: 07/18/2014 17:43:03 ET T: 07/19/2014 03:04:45 ET JOB#: [id number]  cc: Ruthe MannanPeter Venisha Boehning, MD, <Dictator> Althea GrimmerPeter J. Mickle PlumbApel, MD PhD Orthopaedic Surgery ELECTRONICALLY SIGNED 07/19/2014 21:42

## 2015-04-16 NOTE — Consult Note (Signed)
PATIENT NAME:  Michele [name], Michele [name] MR#:  [id number] DATE OF BIRTH:  12-09-1946  DATE OF CONSULTATION:  07/17/2014  REFERRING PHYSICIAN:     Huey Bienenstockawood Elgergawy, MD  CONSULTING PHYSICIAN:  Ruthe MannanPeter Marcanthony Sleight, MD  CHIEF COMPLAINT:  Right ankle pain.   HISTORY OF PRESENT ILLNESS:  The patient is [name] 69 year old female who was walking and suffered [name] mechanical fall. She has had multiple falls recently. She suffered an isolated injury to her right lower extremity. Of note, she is on Coumadin for an aortic valve replacement.   She endorses pain in the right ankle. She denies any pain elsewhere.   PAST MEDICAL HISTORY:  1.  Prosthetic aortic valve replacement.   2.  GERD. 3.  Hypertension.  4.  Hyperlipidemia.  5.  Anxiety.    MEDICATIONS:  See EMR.   ALLERGIES: None.   SOCIAL HISTORY: The patient lives at home alone. She cares for her son who was recently diagnosed with stage IV colon cancer. She does not drink or smoke.   FAMILY HISTORY: Noncontributory.   REVIEW OF SYSTEMS: Positive for frequent falls, positive for anxiety. Negative, other than  discussed in the HPI.  PHYSICAL EXAM:  GENERAL:  The patient is an anxious-appearing female. She is slightly overweight.  She is of small short stature.  HEAD: Atraumatic.   CHEST:  Equal and symmetric chest rise. No increased work of breathing.  ABDOMEN: Soft.  EXTREMITIES: Right lower extremity is markedly swollen. She has tenderness to palpation over the ankle.   RADIOGRAPHS: X-rays were obtained and demonstrated [name] right trimalleolar ankle fracture with posterior and lateral subluxation.   PROCEDURE: The following procedure was performed. [name] closed reduction of right trimalleolar ankle fracture; CPT code [id number].   The nature of the procedure was explained to the patient. She gave her consent to proceed. 20 mL of lidocaine and were instilled into the ankle using an anteromedial approach. This gave the patient [name] dense anesthetic. The ankle was then  examined; it was grossly unstable. [name] combination of longitudinal traction, anterior translation and foot inversion was performed in order to effect [name] reduction. The reduction was held while [name] short leg splint was applied.   POSTREDUCTION X-RAYS: Postreduction x-rays demonstrate persistent lateral and posterior subluxation of the ankle fracture dislocation.   ASSESSMENT: [name] 69 year old female on chronic anticoagulation with an unstable right trimalleolar ankle fracture with marked swelling.   DISCUSSION: I had [name] long discussion with the patient. I explained the nature of trimalleolar ankle fractures; how they are unstable. I explained that given the instability, the failed closed reduction and the massive swelling, that my recommendation is for temporary operative stabilization using an external fixer. The nature of the procedure was explained to her, including showing photographs of the planned external fixator. She would like to proceed with this.   She was admitted to the hospital service for her medical comorbidities, she will be made n.p.o. after midnight and is planned for operative stabilization of the ankle fracture with external fixation tomorrow.   ____________________________ Ruthe MannanPeter Yoshiye Kraft, MD pa:aj D: 07/18/2014 01:01:00 ET T: 07/18/2014 02:58:15 ET JOB#: [id number]  cc: Ruthe MannanPeter Zeniyah Peaster, MD, <Dictator> Althea GrimmerPeter J. Mickle PlumbApel, MD PhD Orthopaedic Surgery ELECTRONICALLY SIGNED 07/18/2014 17:57

## 2015-04-16 NOTE — H&P (Signed)
PATIENT NAME:  Michele [name], TEJERA MR#:  [id number] DATE OF BIRTH:  [birth year]  DATE OF ADMISSION:  07/17/2014  REFERRING PHYSICIAN:  Dr. Mindi Junker.   PRIMARY CARE PHYSICIAN:  [facility].   CHIEF COMPLAINT: Fall with right angle fracture.   HISTORY OF PRESENT ILLNESS: This is a 69 year old female with known history of hypertension, hyperlipidemia, mechanical hard falls on warfarin, presents today post fall as she reports she stepped wrong on one of her steps where she twisted her right ankle. X-ray showing trimalleolar fracture. The patient had a closed reduction done by Dr. Mickle Plumb. The patient lives by herself. She has no assistance as well, so hospitalist requested to admit the patient for possible need of placement, as well after repeat of x-ray of the ankle, there is not much improvement in the fracture after closed reduction, so the patient is as well being planned to have external fixation done in the morning. The patient is known to have history of aortic valve replacement where she is on warfarin. Her INR is 2. I discussed with ortho. He will do external fixation and no need to reverse her anticoagulation. The patient denies any chest pain, any shortness of breath, any fever, any chills, any cough or any productive sputum. The patient was afebrile upon presentation and her labs did not show significant abnormalities. Her INR was stable.   PAST MEDICAL HISTORY: 1.  History of fall. 2.  History of aortic valve replacement.  3.  Mechanical falls. 4.  GERD. 5.  Hypertension.  6.  Hyperlipidemia.  7.  Anxiety.  8.  Agoraphobia.   ALLERGIES:  LASIX TO WHICH SHE GETS A RASH.  SOCIAL HISTORY: She used to be a smoker, quit 20 years ago; does have 30 pack-year smoking history. No alcohol abuse. No illicit drug abuse.  Lives at home with her son.   FAMILY HISTORY: Mother died from complications of acute pancreatitis. Father died from complications of heart disease; he was also diabetic.   HOME  MEDICATIONS: 1.  Warfarin 5 mg on Tuesday, 7.5 mg every other day.  3.  Simvastatin 20 mg oral daily.  4.  Nexium 40 mg oral daily.  5.  Metoprolol tartrate 12.5 mg oral 2 times a day. 6.  Xanax 0.5 mg daily as needed.   REVIEW OF SYSTEMS:  CONSTITUTIONAL: Denies fever, chills, fatigue, weakness.  EYES: Denies blurry vision, double vision, inflammation.  EARS, NOSE, THROAT: Denies tinnitus, ear pain, hearing loss, epistaxis.  RESPIRATORY: Denies cough, wheezing, hemoptysis, dyspnea.  CARDIOVASCULAR: Denies chest pain, orthopnea, edema, arrhythmia.  GASTROINTESTINAL: Denies nausea, vomiting, diarrhea, abdominal pain, hematemesis.  GENITOURINARY: Denies dysuria, hematuria, renal colic.  ENDOCRINE: Denies polyuria, polydipsia, heat or cold intolerance.  HEMATOLOGIC: Denies anemia, easy bruising, bleeding diathesis.  INTEGUMENT: Denies acne, rash or skin lesion.  MUSCULOSKELETAL: Denies any gout, cramps, arthritis. Has right ankle pain, status post fall; ankle fracture.  NEUROLOGIC: Denies CVA, TIA, tremors, vertigo, numbness, dysarthria.  PSYCHIATRIC: Positive history of anxiety and depression. Denies insomnia, schizophrenia.   PHYSICAL EXAMINATION: VITAL SIGNS: :Pulse 64, respiratory rate 14, blood pressure 98/54, saturating 100% on oxygen.  GENERAL: Well-nourished female who looks comfortable in bed, in no apparent distress.  HEENT: Head atraumatic, normocephalic. Pupils equal, reactive to light. Pink conjunctivae. Anicteric sclerae. Moist oral mucosa.  NECK: Supple. No thyromegaly. No JVD.  CHEST: Good air entry bilaterally. No wheezing, rales, rhonchi.  CARDIOVASCULAR: S1, S2 heard. No rubs, murmurs or gallops.  ABDOMEN: Soft, nontender, nondistended. Bowel sounds present.  EXTREMITIES: Has right lower extremity in a cast with good capillary refill.  Left lower extremity, no edema, no clubbing, no cyanosis. Pedal pulses felt.  PSYCHIATRIC: Appropriate affect. Awake, alert x 3.  Intact judgment and insight.  NEUROLOGIC: Cranial nerves grossly intact. Motor 5/5. No focal deficits noticed. Right lower extremity movement limited secondary to her cast.  SKIN: Normal skin turgor. Warm and dry.   PERTINENT LABORATORY DATA: Glucose 92, BUN 19, creatinine 0.69, sodium 144, potassium 3.6, chloride 111, CO2 29. White blood cell 5.9, hemoglobin 13.4, hematocrit 41, platelets 123, INR is 2.  ASSESSMENT AND PLAN: 1.  Right ankle fracture/trimalleolar fracture. The patient had reduction attempted by ortho in the Emergency Department, will need external fixation to be done in the morning. The patient will be admitted. Will be kept nothing by mouth, will be kept on fluids. We will have her on p.r.n. pain and nausea medicine. Discussed with orthopedic regarding anticoagulation; there is no need to in to reverse anticoagulation as this procedure just includes pins inserted externally and external frame as well.  2.  History for aortic valve replacement. The patient's INR is 2; subtherapeutic. Will consult pharmacy to dose her warfarin for target INR 2.5 to 3.5.  3.  Gastroesophageal reflux disease. Continue with proton pump inhibitor.  4.  Hypertension. Blood pressure on the lower side. We will keep her on her low-dose metoprolol dose. 5.  Hyperlipidemia. Continue with statin. 6.  Anxiety. Continue with p.r.n. Xanax.  7.  Deep vein thrombosis prophylaxis. The patient is anticoagulated with warfarin.  8.  CODE STATUS: Full code.   TOTAL TIME SPENT ON ADMISSION AND PATIENT CARE:  55 minutes.   ____________________________ Starleen Armsawood S. Massie Cogliano, MD dse:aj D: 07/18/2014 00:50:05 ET T: 07/18/2014 03:07:34 ET JOB#: [id number]  cc: Starleen Armsawood S. Merion Grimaldo, MD, <Dictator> Kayleeann Huxford Teena IraniS Edwin Baines MD ELECTRONICALLY SIGNED 07/19/2014 7:22

## 2016-06-29 IMAGING — CR RIGHT ANKLE - COMPLETE 3+ VIEW
1 series · 4 of 4 positions shown · non-contrast
Comparison: None.

CLINICAL DATA: Pain post trauma

EXAM:
RIGHT ANKLE - COMPLETE 3+ VIEW

[Series 1: ap · 0.17mm/px · 4 of 4 slices shown]
[im 1/4]
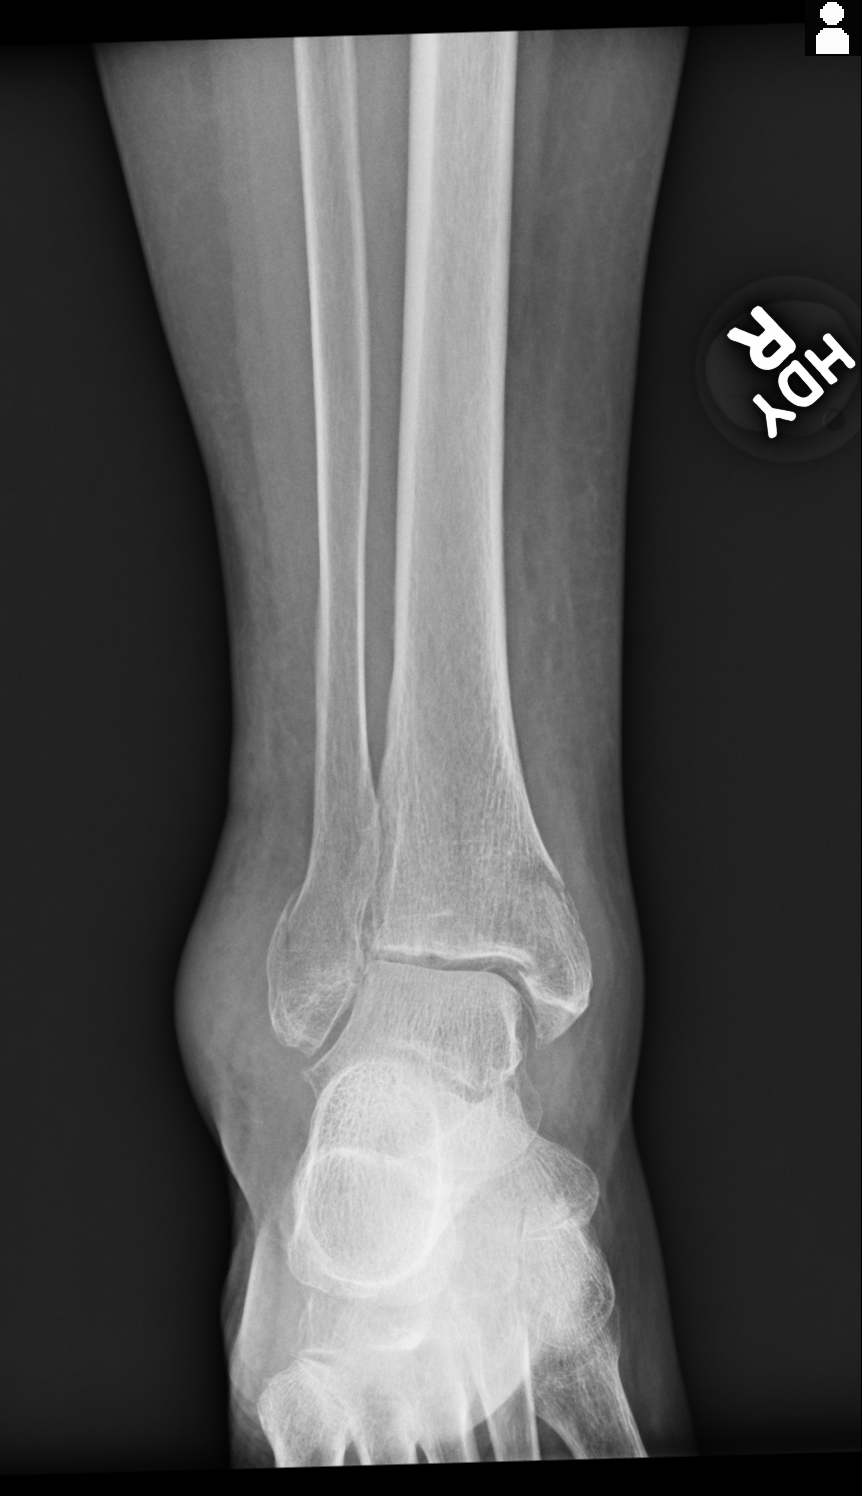
[im 2/4]
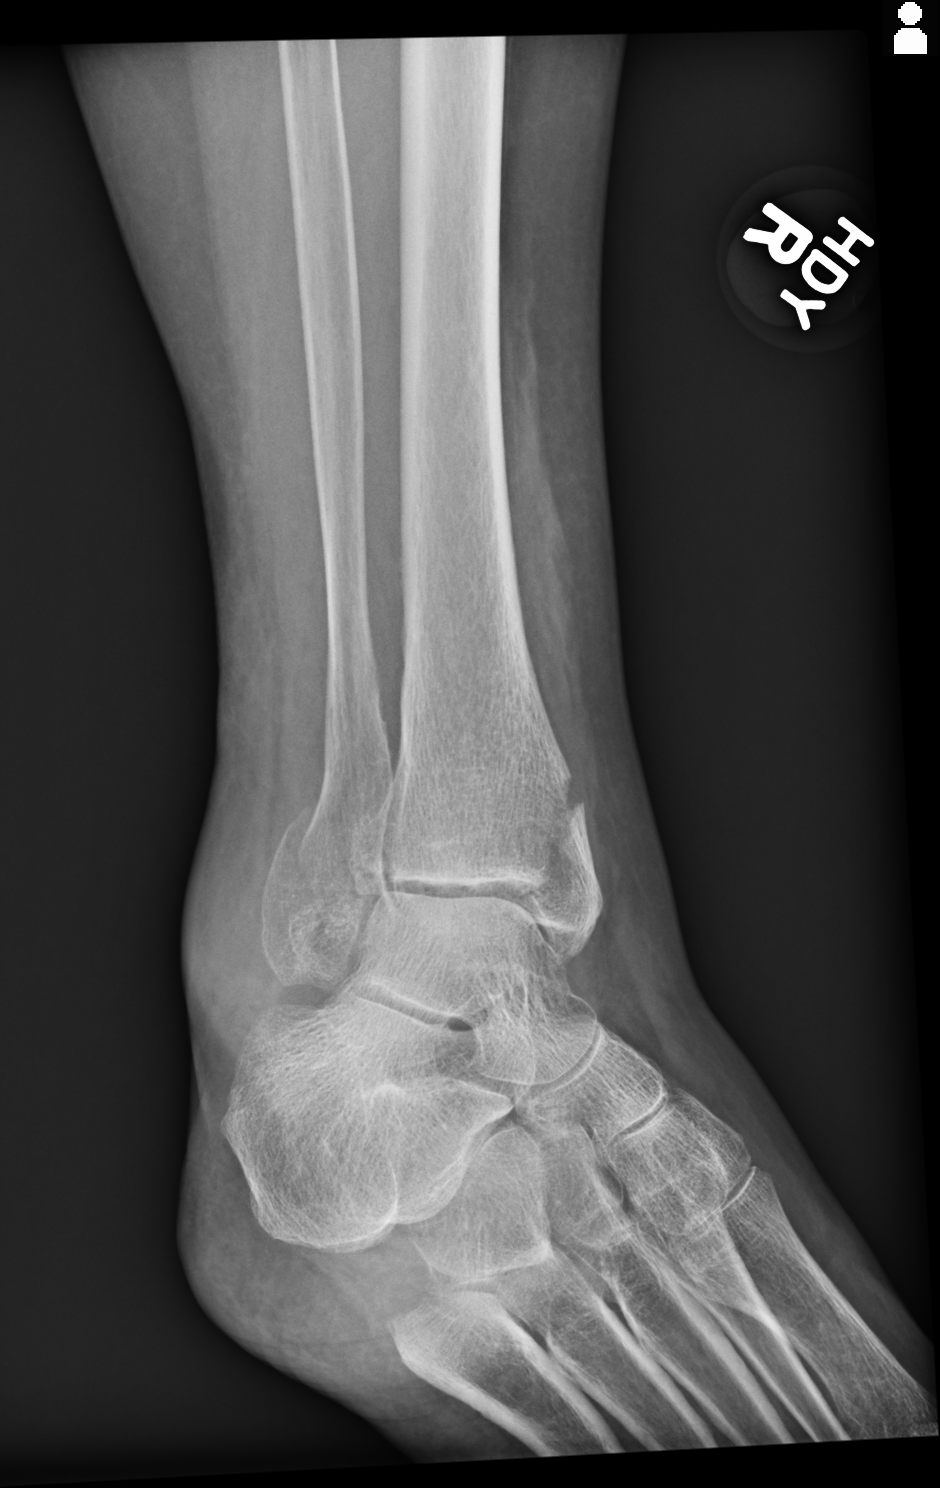
[im 3/4]
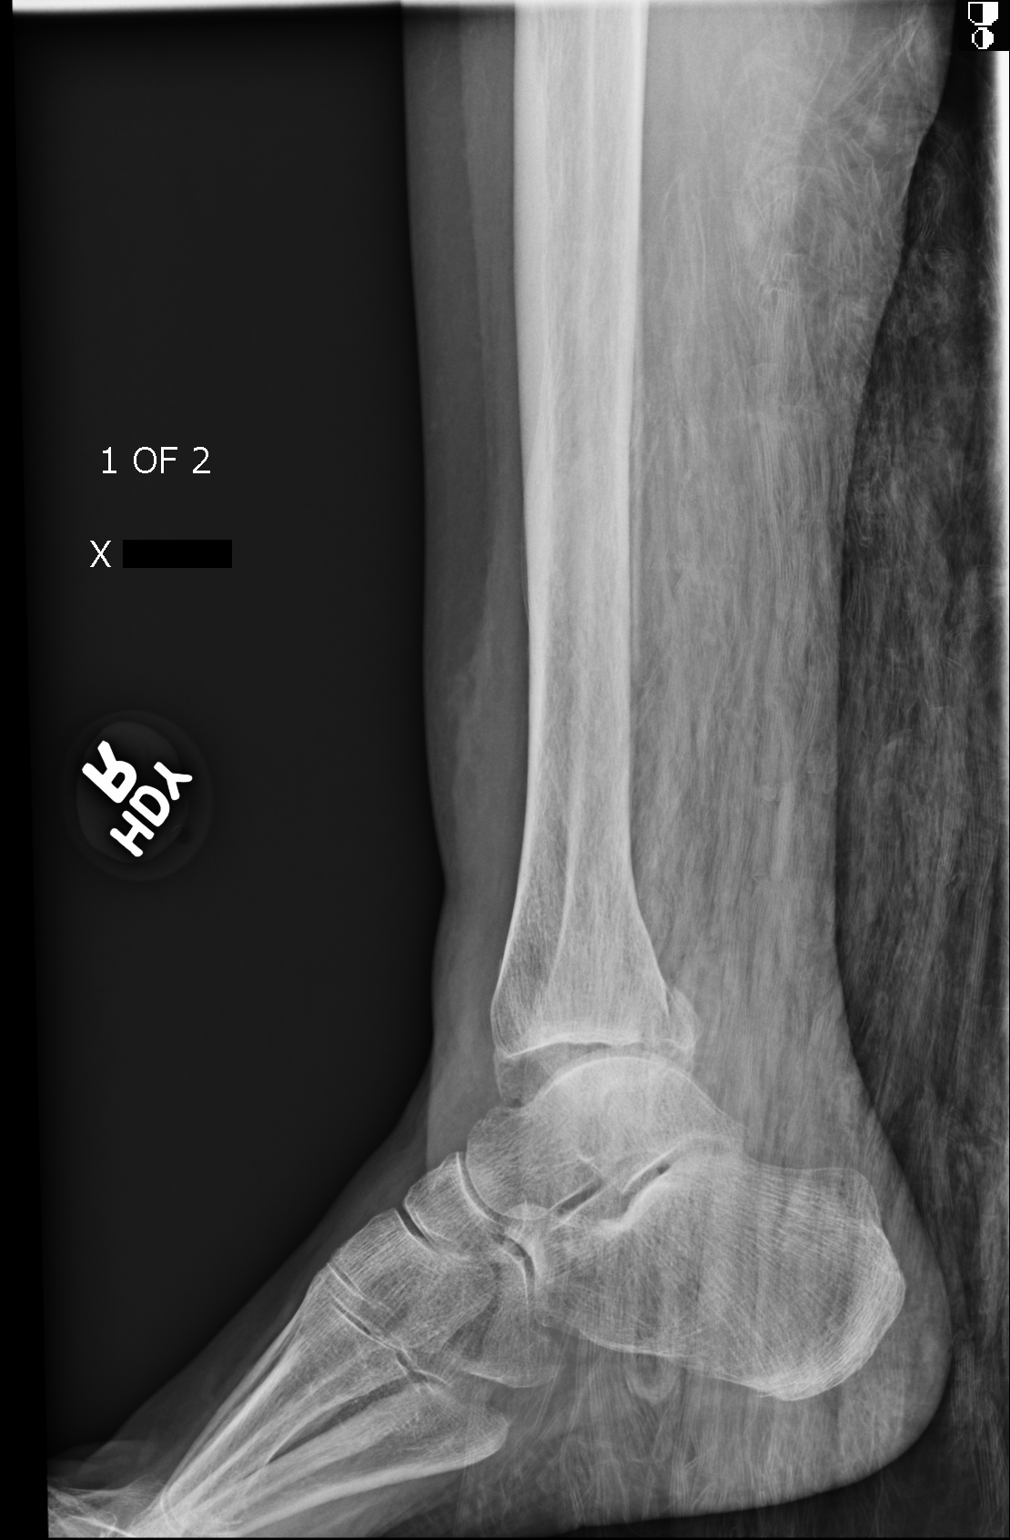
[im 4/4]
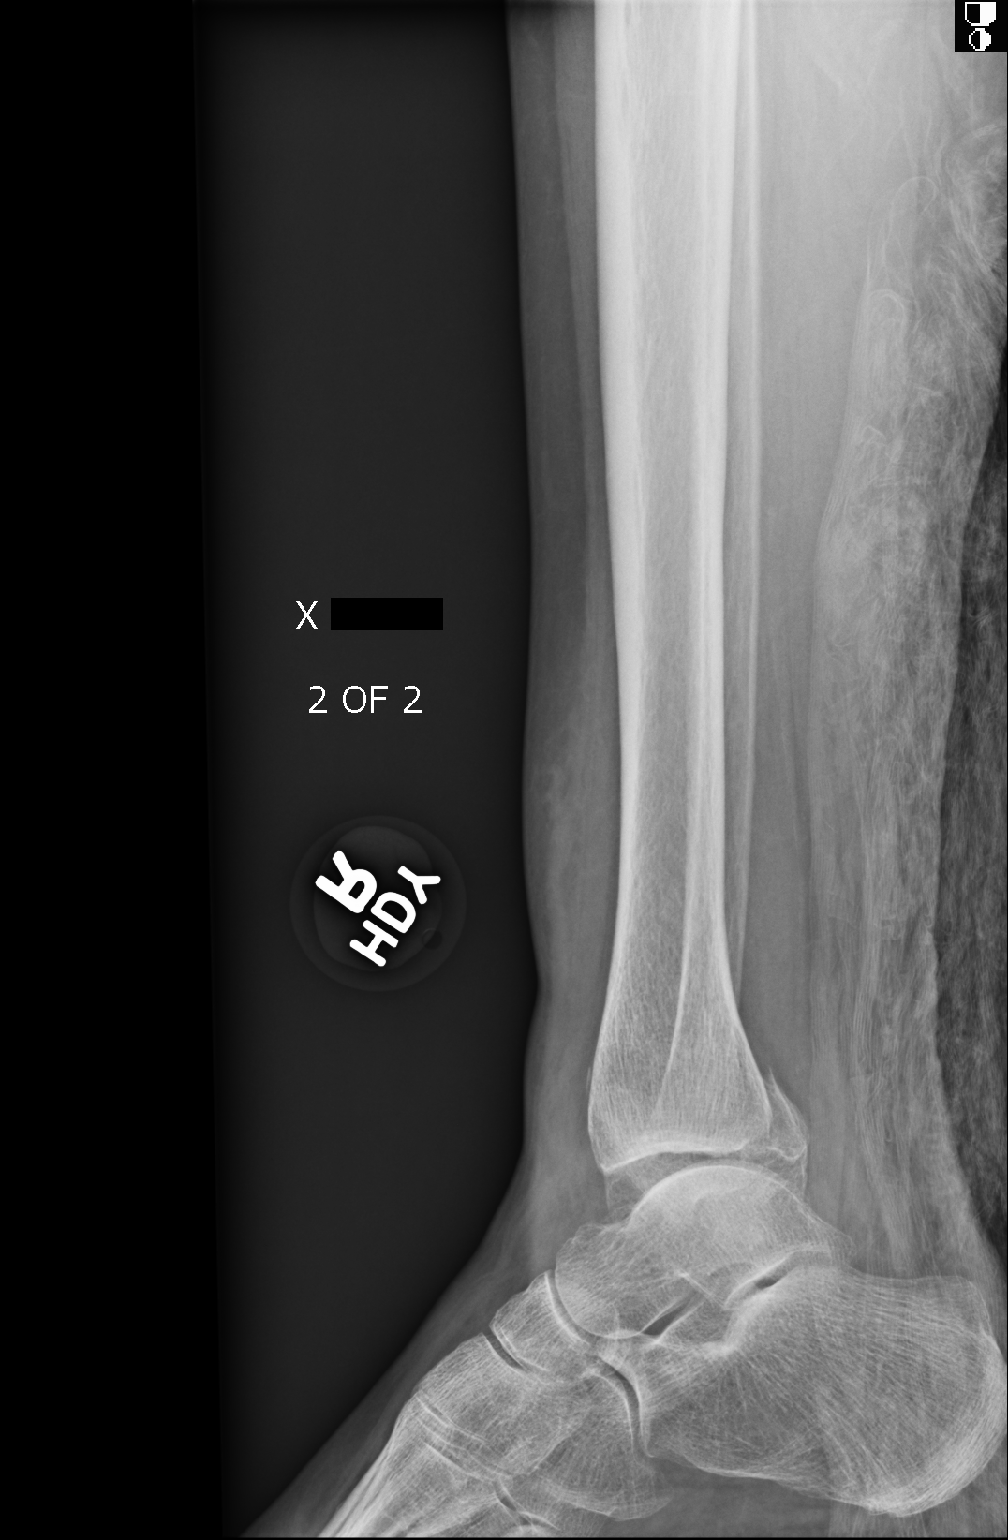

[4 of 4 positions shown; findings below may reference images not displayed]

FINDINGS: Frontal, oblique, and lateral views were obtained. There is marked
generalized soft tissue swelling. There is a fracture of the medial
malleolus which is obliquely oriented. The fracture extends into the
medial most aspect of the tibial plafond. The medial malleolus is
mildly displaced compared to the remainder the tibia. There is a
fracture of the posterior tibia distally is well. There is a
fracture of the distal fibular metaphysis with lateral angulation
distally.

On the lateral view, there is a suggestion of ankle mortise
disruption.
IMPRESSION: Fractures of the medial malleolus, distal fibula, and distal tibia
with evidence on the lateral view of mortise disruption. There is
marked generalized soft tissue swelling. Soft tissue swelling is
most dramatic laterally.

## 2016-08-10 IMAGING — CR DG CHEST 1V PORT
1 series · 1 of 1 positions shown · non-contrast
Comparison: 08/20/2014

CLINICAL DATA: Short of breath

EXAM:
PORTABLE CHEST - 1 VIEW

[ap]
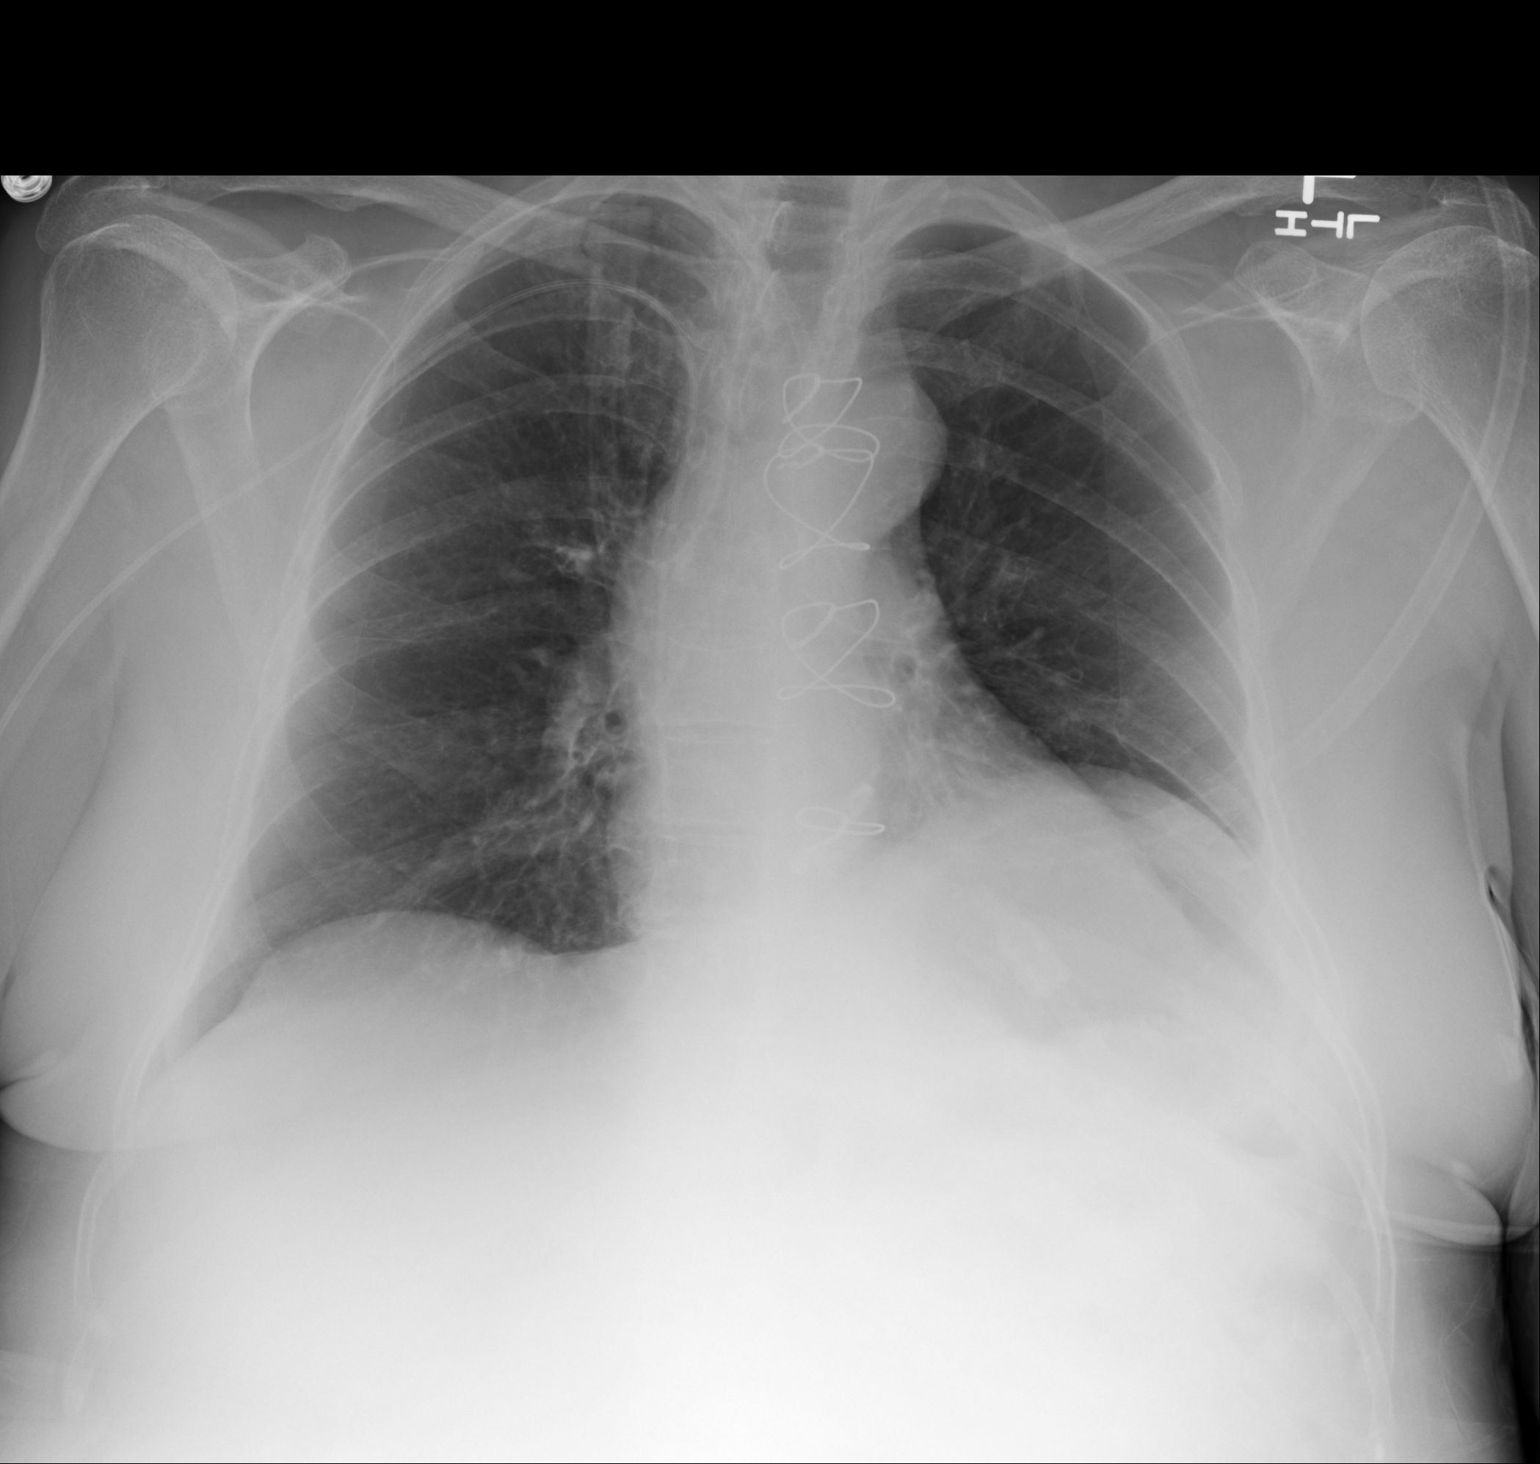

[1 of 1 positions shown; findings below may reference images not displayed]

FINDINGS: Stable right PICC. Normal heart size. Elevated left hemidiaphragm
with basilar atelectasis. No pneumothorax.
IMPRESSION: No active cardiopulmonary disease.
